# Patient Record
Sex: Female | Born: 1985 | Race: Black or African American | Hispanic: No | Marital: Single | Smoking: Current every day smoker
Health system: Southern US, Community
[De-identification: ages and names within clinical notes are randomized; demographics above are authoritative.]

## PROBLEM LIST (undated history)

## (undated) ENCOUNTER — Inpatient Hospital Stay (HOSPITAL_COMMUNITY): Payer: Self-pay

## (undated) DIAGNOSIS — B999 Unspecified infectious disease: Secondary | ICD-10-CM

## (undated) DIAGNOSIS — M549 Dorsalgia, unspecified: Secondary | ICD-10-CM

## (undated) DIAGNOSIS — J45909 Unspecified asthma, uncomplicated: Secondary | ICD-10-CM

## (undated) DIAGNOSIS — N63 Unspecified lump in unspecified breast: Secondary | ICD-10-CM

## (undated) HISTORY — PX: NO PAST SURGERIES: SHX2092

## (undated) HISTORY — PX: INDUCED ABORTION: SHX677

---
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
## 2007-03-06 ENCOUNTER — Inpatient Hospital Stay (HOSPITAL_COMMUNITY): Admission: AD | Admit: 2007-03-06 | Discharge: 2007-03-06 | Payer: Self-pay | Admitting: Obstetrics and Gynecology

## 2007-04-10 ENCOUNTER — Emergency Department (HOSPITAL_COMMUNITY): Admission: EM | Admit: 2007-04-10 | Discharge: 2007-04-10 | Payer: Self-pay | Admitting: Emergency Medicine

## 2007-05-12 ENCOUNTER — Ambulatory Visit (HOSPITAL_COMMUNITY): Admission: RE | Admit: 2007-05-12 | Discharge: 2007-05-12 | Payer: Self-pay | Admitting: Obstetrics

## 2007-10-05 ENCOUNTER — Inpatient Hospital Stay (HOSPITAL_COMMUNITY): Admission: AD | Admit: 2007-10-05 | Discharge: 2007-10-05 | Payer: Self-pay | Admitting: Obstetrics

## 2007-10-08 ENCOUNTER — Inpatient Hospital Stay (HOSPITAL_COMMUNITY): Admission: AD | Admit: 2007-10-08 | Discharge: 2007-10-08 | Payer: Self-pay | Admitting: Obstetrics

## 2007-10-09 ENCOUNTER — Inpatient Hospital Stay (HOSPITAL_COMMUNITY): Admission: AD | Admit: 2007-10-09 | Discharge: 2007-10-11 | Payer: Self-pay | Admitting: Obstetrics & Gynecology

## 2010-03-25 ENCOUNTER — Encounter: Payer: Self-pay | Admitting: Internal Medicine

## 2010-11-22 LAB — CBC
HCT: 28.1 — ABNORMAL LOW
Hemoglobin: 9.4 — ABNORMAL LOW
MCHC: 33.4
Platelets: 216
RBC: 3.8 — ABNORMAL LOW
RDW: 21.1 — ABNORMAL HIGH
WBC: 9

## 2010-11-22 LAB — POCT PREGNANCY, URINE: Operator id: 239321

## 2010-11-22 LAB — URINE CULTURE

## 2010-11-22 LAB — URINALYSIS, ROUTINE W REFLEX MICROSCOPIC
Ketones, ur: NEGATIVE
Nitrite: POSITIVE — AB
Protein, ur: NEGATIVE
Urobilinogen, UA: 0.2
pH: 7

## 2010-11-22 LAB — COMPREHENSIVE METABOLIC PANEL
ALT: 11
Albumin: 3.4 — ABNORMAL LOW
Alkaline Phosphatase: 40
CO2: 25
Chloride: 103
Creatinine, Ser: 0.55
GFR calc Af Amer: >60
GFR calc non Af Amer: >60
Glucose, Bld: 95
Potassium: 3.6

## 2010-11-22 LAB — URINE MICROSCOPIC-ADD ON

## 2010-11-22 LAB — WET PREP, GENITAL: Yeast Wet Prep HPF POC: NONE SEEN

## 2010-11-22 LAB — GC/CHLAMYDIA PROBE AMP, GENITAL: Chlamydia, DNA Probe: NEGATIVE

## 2010-11-23 LAB — CBC
HCT: 31.7 — ABNORMAL LOW
Hemoglobin: 10.7 — ABNORMAL LOW
MCHC: 33.6
MCV: 76.8 — ABNORMAL LOW
RDW: 21.2 — ABNORMAL HIGH
WBC: 14 — ABNORMAL HIGH

## 2010-11-23 LAB — DIFFERENTIAL
Basophils Absolute: 0
Basophils Relative: 0
Lymphocytes Relative: 8 — ABNORMAL LOW
Lymphs Abs: 1.1
Monocytes Absolute: 0.4
Monocytes Relative: 3
Neutrophils Relative %: 89 — ABNORMAL HIGH

## 2010-11-23 LAB — BASIC METABOLIC PANEL
CO2: 23
Calcium: 9.2
Creatinine, Ser: 0.46
GFR calc non Af Amer: >60

## 2010-11-30 LAB — CBC
HCT: 26.2 — ABNORMAL LOW
Hemoglobin: 8.9 — ABNORMAL LOW
MCHC: 32.1
MCHC: 32.3
Platelets: 109 — ABNORMAL LOW
Platelets: 130 — ABNORMAL LOW
RBC: 3.05 — ABNORMAL LOW
RBC: 3.6 — ABNORMAL LOW
RDW: 15.2
RDW: 15.4
WBC: 10.4
WBC: 13.3 — ABNORMAL HIGH

## 2010-11-30 LAB — CCBB MATERNAL DONOR DRAW

## 2010-11-30 LAB — RPR: RPR Ser Ql: NONREACTIVE

## 2010-12-17 LAB — URINE MICROSCOPIC-ADD ON

## 2010-12-17 LAB — CBC
Platelets: 159
WBC: 23 — ABNORMAL HIGH

## 2010-12-17 LAB — I-STAT 8, (EC8 V) (CONVERTED LAB)
BUN: 8
Chloride: 106
Glucose, Bld: 86
Potassium: 4.6
pH, Ven: 7.292

## 2010-12-17 LAB — DIFFERENTIAL
Eosinophils Absolute: 0
Lymphocytes Relative: 2 — ABNORMAL LOW
Lymphs Abs: 0.4 — ABNORMAL LOW
Neutro Abs: 22.4 — ABNORMAL HIGH
Neutrophils Relative %: 95 — ABNORMAL HIGH

## 2010-12-17 LAB — POCT URINALYSIS DIP (DEVICE)
Glucose, UA: 100 — AB
Nitrite: NEGATIVE
Operator id: 247071
Protein, ur: >=300 — AB
Urobilinogen, UA: 1

## 2010-12-17 LAB — GC/CHLAMYDIA PROBE AMP, GENITAL: GC Probe Amp, Genital: POSITIVE — AB

## 2010-12-17 LAB — URINALYSIS, ROUTINE W REFLEX MICROSCOPIC
Ketones, ur: 15 — AB
Nitrite: NEGATIVE
Specific Gravity, Urine: 1.018
pH: 6

## 2010-12-17 LAB — LIPASE, BLOOD: Lipase: 10 — ABNORMAL LOW

## 2010-12-17 LAB — COMPREHENSIVE METABOLIC PANEL
Alkaline Phosphatase: 73
BUN: 8
Chloride: 104
Glucose, Bld: 98
Potassium: 4.2
Total Bilirubin: 1.1
Total Protein: 8.9 — ABNORMAL HIGH

## 2010-12-17 LAB — POCT PREGNANCY, URINE: Operator id: 239701

## 2012-03-04 NOTE — L&D Delivery Note (Signed)
Attestation of Attending Supervision of Advanced Practitioner: Evaluation and management procedures were performed by the PA/NP/CNM/OB Fellow under my supervision/collaboration. Chart reviewed and agree with management and plan.  Saber Dickerman V 10/09/2012 9:18 PM

## 2012-03-04 NOTE — L&D Delivery Note (Signed)
Delivery Note At 8:08 PM a viable and healthy female was delivered via Vaginal, Spontaneous Delivery (Presentation: Left Occiput Anterior;  ).  APGAR: 9,9 ; weight pending.   Placenta status: intact, spontaneous .  Cord: 3 vessel with the following complications: none.    Anesthesia:   Episiotomy:  Lacerations: none Suture Repair: none Est. Blood Loss (mL): 300  Mom to postpartum.  Baby to nursery-stable.  Tawni Carnes 10/09/2012, 8:22 PM

## 2012-04-06 ENCOUNTER — Inpatient Hospital Stay (HOSPITAL_COMMUNITY)
Admission: AD | Admit: 2012-04-06 | Discharge: 2012-04-06 | Disposition: A | Discharge status: Home / Self Care | Payer: Medicaid Other | Source: Ambulatory Visit | Attending: Obstetrics & Gynecology | Admitting: Obstetrics & Gynecology

## 2012-04-06 ENCOUNTER — Encounter (HOSPITAL_COMMUNITY): Payer: Self-pay

## 2012-04-06 DIAGNOSIS — R51 Headache: Secondary | ICD-10-CM

## 2012-04-06 DIAGNOSIS — M545 Low back pain, unspecified: Secondary | ICD-10-CM | POA: Insufficient documentation

## 2012-04-06 DIAGNOSIS — R42 Dizziness and giddiness: Secondary | ICD-10-CM

## 2012-04-06 DIAGNOSIS — H538 Other visual disturbances: Secondary | ICD-10-CM

## 2012-04-06 DIAGNOSIS — M549 Dorsalgia, unspecified: Secondary | ICD-10-CM

## 2012-04-06 DIAGNOSIS — O99891 Other specified diseases and conditions complicating pregnancy: Secondary | ICD-10-CM

## 2012-04-06 DIAGNOSIS — O219 Vomiting of pregnancy, unspecified: Secondary | ICD-10-CM

## 2012-04-06 HISTORY — DX: Unspecified asthma, uncomplicated: J45.909

## 2012-04-06 LAB — URINE MICROSCOPIC-ADD ON

## 2012-04-06 LAB — CBC
MCH: 25.9 pg — ABNORMAL LOW (ref 26.0–34.0)
MCV: 76.8 fL — ABNORMAL LOW (ref 78.0–100.0)
Platelets: 163 10*3/uL (ref 150–400)
RBC: 4.06 MIL/uL (ref 3.87–5.11)
RDW: 16.2 % — ABNORMAL HIGH (ref 11.5–15.5)

## 2012-04-06 LAB — URINALYSIS, ROUTINE W REFLEX MICROSCOPIC
Nitrite: NEGATIVE
Specific Gravity, Urine: 1.025 (ref 1.005–1.030)
pH: 6 (ref 5.0–8.0)

## 2012-04-06 MED ORDER — PROMETHAZINE HCL 25 MG PO TABS: 12.5000 mg | ORAL_TABLET | Freq: Four times a day (QID) | ORAL | Status: DC | PRN

## 2012-04-06 MED ORDER — PRENATAL VITAMINS 28-0.8 MG PO TABS: 1.0000 | ORAL_TABLET | Freq: Every day | ORAL | Status: DC

## 2012-04-06 NOTE — MAU Note (Signed)
Patient states she has periods of feeling faint and dizzy for about 3 weeks, mostly at her job. Has had low back pain on the right side for 4 days. Nausea for about one month with occasional vomiting. Patient has had a positive home pregnancy test in December.

## 2012-04-06 NOTE — MAU Provider Note (Signed)
Attestation of Attending Supervision of Advanced Practitioner (PA/CNM/NP): Evaluation and management procedures were performed by the Advanced Practitioner under my supervision and collaboration.  I have reviewed the Advanced Practitioner's note and chart, and I agree with the management and plan.  Julietta Batterman, MD, FACOG Attending Obstetrician & Gynecologist Faculty Practice, [facility]   

## 2012-04-06 NOTE — MAU Provider Note (Signed)
History    CSN: [id number]  Arrival date and time: 04/06/12 1017   First Provider Initiated Contact with Patient 04/06/12 1139      Chief Complaint  Patient presents with  . Dizziness  . Back Pain  . Nausea   HPI Patient reports to the MAU complaining of episodes of dizziness that are accompanied by blurry vision, headaches and nausea. Patient is [redacted]w[redacted]d pregnant. The patient reports that she has been having the episodes over the last 3 weeks, and notices them more at work. If she sits down during an episode the dizziness will subside over 30 minutes to and hour. She denies any loss of consciousness or fainting. 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 She reports she is doing her best to drink a lot of water throughout the day, and is eating multiple meals daily.   She reports an episode of fainting during her pregnancy 4 years ago that was followed by similar dizzy symptoms. She was taken to the emergency room for her previous episode of fainting, but was discharged later that same day. Patient does not remember any diagnosis coming from this ED visit, and had no problems with the rest of her pregnancy.   The patient also complains of low right back pain for 3 weeks. She denies any trauma to her low back, and denies and heavy lifting or exercise over this time. She also denies any burning with urination or hematuria. She reports a "pulling sensation" when she moves around, but denies any sharp pain, numbness or tingling. She reports that a heating pad has helped with her back pain.  She denies any previous medical problems and is planning to see Dr. Clearance Coots for her prenatal care, but it awaiting insurance clearance.  OB History    Grav Para Term Preterm Abortions TAB SAB Ect Mult Living   4 2 2  1 1    2       Past Medical History  Diagnosis Date  . Asthma     Past Surgical  History  Procedure Date  . No past surgeries     History reviewed. No pertinent family history.  History  Substance Use Topics  . Smoking status: Current Some Day Smoker    Types: Cigarettes  . Smokeless tobacco: Not on file  . Alcohol Use: No    Allergies:  Allergies  Allergen Reactions  . Peanut-Containing Drug Products Anaphylaxis    Pecans also  . Shellfish Allergy Anaphylaxis    Prescriptions prior to admission  Medication Sig Dispense Refill  . OVER THE COUNTER MEDICATION Apply 1 application topically daily. Topical cream for eczema, pt purchases over the counter      . promethazine (PHENERGAN) 25 MG tablet Take 25 mg by mouth daily as needed. For nausea        Review of Systems  Eyes: Positive for blurred vision.  Cardiovascular: Negative for chest pain and leg swelling.  Gastrointestinal: Positive for nausea. Negative for abdominal pain and diarrhea.  Genitourinary: Negative for dysuria, urgency and hematuria.  Musculoskeletal: Positive for back pain. Negative for falls.  Neurological: Positive for dizziness and headaches. Negative for seizures and loss of consciousness.  GU: Negative for vaginal bleeding, negative for vaginal discharge.  Physical Exam   Blood pressure 115/72, pulse 112, temperature 98.2 F (36.8 C), resp. rate 16, height 5' 4.5" (1.638  m), weight 56.881 kg (125 lb 6.4 oz), last menstrual period 01/07/2012, SpO2 100.00%.  Physical Exam  Constitutional: She appears well-developed and well-nourished.  Eyes: Conjunctivae normal are normal. Pupils are equal, round, and reactive to light.  Cardiovascular: Normal rate, regular rhythm and normal heart sounds.   Respiratory: Effort normal and breath sounds normal.  GI: Soft. There is no tenderness.  Musculoskeletal: Normal range of motion. She exhibits tenderness.   FHT 160 by doppler  MAU Course  Procedures Labs:  CBC: Hgb 10.5, Hct 31.2, MCV 76.8, MCH 25.9, RDW 16.2   Assessment and Plan    1. Intrauterine pregnancy at [redacted]w[redacted]d 2. Hgb 10.5--increase iron-containing foods in diet, PNV prescribed 3. Dizziness- continue to drink water and take frequent breaks from standing at work when possible.  Letter given for employer to allow sitting, increase PO fluids. 4. Low back pain, right- continue to use heating pad, rest, tylenol PRN for pain   I have seen this patient and agree with the above PA student's note.  Pt to start prenatal care as scheduled with Dr Clearance Coots.    LEFTWICH-KIRBY, LISA Certified Nurse-Midwife  LEFTWICH-KIRBY, LISA 04/06/2012, 12:27 PM

## 2012-04-15 ENCOUNTER — Other Ambulatory Visit: Payer: Self-pay | Admitting: Advanced Practice Midwife

## 2012-07-03 ENCOUNTER — Inpatient Hospital Stay (HOSPITAL_COMMUNITY)
Admission: AD | Admit: 2012-07-03 | Discharge: 2012-07-03 | Disposition: A | Discharge status: Home / Self Care | Payer: Medicaid Other | Source: Ambulatory Visit | Attending: Obstetrics & Gynecology | Admitting: Obstetrics & Gynecology

## 2012-07-03 ENCOUNTER — Encounter (HOSPITAL_COMMUNITY): Payer: Self-pay | Admitting: *Deleted

## 2012-07-03 DIAGNOSIS — O26899 Other specified pregnancy related conditions, unspecified trimester: Secondary | ICD-10-CM

## 2012-07-03 DIAGNOSIS — O2390 Unspecified genitourinary tract infection in pregnancy, unspecified trimester: Secondary | ICD-10-CM

## 2012-07-03 DIAGNOSIS — R109 Unspecified abdominal pain: Secondary | ICD-10-CM | POA: Insufficient documentation

## 2012-07-03 DIAGNOSIS — O2342 Unspecified infection of urinary tract in pregnancy, second trimester: Secondary | ICD-10-CM

## 2012-07-03 DIAGNOSIS — N390 Urinary tract infection, site not specified: Secondary | ICD-10-CM | POA: Insufficient documentation

## 2012-07-03 HISTORY — DX: Unspecified lump in unspecified breast: N63.0

## 2012-07-03 LAB — URINALYSIS, ROUTINE W REFLEX MICROSCOPIC
Bilirubin Urine: NEGATIVE
Hgb urine dipstick: NEGATIVE
Ketones, ur: NEGATIVE mg/dL
Nitrite: POSITIVE — AB
pH: 6 (ref 5.0–8.0)

## 2012-07-03 LAB — CBC WITH DIFFERENTIAL/PLATELET
Basophils Absolute: 0 10*3/uL (ref 0.0–0.1)
Basophils Relative: 0 % (ref 0–1)
Eosinophils Absolute: 0.2 10*3/uL (ref 0.0–0.7)
Eosinophils Relative: 2 % (ref 0–5)
Lymphs Abs: 1.6 10*3/uL (ref 0.7–4.0)
MCH: 25.1 pg — ABNORMAL LOW (ref 26.0–34.0)
MCHC: 32.5 g/dL (ref 30.0–36.0)
MCV: 77.1 fL — ABNORMAL LOW (ref 78.0–100.0)
Neutrophils Relative %: 80 % — ABNORMAL HIGH (ref 43–77)
Platelets: 126 10*3/uL — ABNORMAL LOW (ref 150–400)
RBC: 3.27 MIL/uL — ABNORMAL LOW (ref 3.87–5.11)
RDW: 14.7 % (ref 11.5–15.5)

## 2012-07-03 LAB — URINE MICROSCOPIC-ADD ON

## 2012-07-03 LAB — WET PREP, GENITAL
Trich, Wet Prep: NONE SEEN
Yeast Wet Prep HPF POC: NONE SEEN

## 2012-07-03 MED ORDER — CEPHALEXIN 500 MG PO CAPS: 500.0000 mg | ORAL_CAPSULE | Freq: Three times a day (TID) | ORAL | Status: DC

## 2012-07-03 NOTE — MAU Note (Signed)
Spotting yesterday. No bleeding today. Cramping off and on for a week. Had intercourse 5 days ago.

## 2012-07-03 NOTE — MAU Provider Note (Signed)
History     CSN: [id number]  Arrival date and time: 07/03/12 1552   First Provider Initiated Contact with Patient 07/03/12 1632      Chief Complaint  Patient presents with  . Abdominal Cramping   HPI Gabrielle [name] is 27 y.o. [phone] [redacted]w[redacted]d weeks presenting with cramping off and on X 1 week.  Spotting yesterday, none today.  Sexually active X 1 partner.  Denies UTI, nausea and vomiting, diarrhea,constipation.  LMP 01/06/13, normal menses.  Plans care with Dr. Clearance Coots after her Medicaid is approved.  She states she applied in January and is having difficulty getting through   Denies hx of preterm labor.  Had intercourse 5 days ago.   Past Medical History  Diagnosis Date  . Asthma   . Breast mass     benign, L breast    Past Surgical History  Procedure Laterality Date  . No past surgeries      History reviewed. No pertinent family history.  History  Substance Use Topics  . Smoking status: Former Smoker    Types: Cigarettes    Quit date: 04/05/2012  . Smokeless tobacco: Not on file  . Alcohol Use: No    Allergies:  Allergies  Allergen Reactions  . Peanut-Containing Drug Products Anaphylaxis    Pecans also  . Shellfish Allergy Anaphylaxis    Prescriptions prior to admission  Medication Sig Dispense Refill  . acetaminophen (TYLENOL) 325 MG tablet Take 650 mg by mouth every 6 (six) hours as needed for pain (abd. cramping).      Marland Kitchen acetaminophen (TYLENOL) 500 MG chewable tablet Chew 500 mg by mouth every 6 (six) hours as needed for pain.      . Prenatal Vit-Min-FA-Fish Oil ([facility] PRENATAL GUMMY PO) Take 2 tablets by mouth daily.      . Simethicone (ALKA-SELTZER ANTI-GAS PO) Take 1 tablet by mouth daily as needed (indigestion).        Review of Systems  Constitutional: Negative for fever and chills.  Gastrointestinal: Positive for abdominal pain (intermittent cramping X 1 week). Negative for nausea, vomiting, diarrhea and constipation.  Genitourinary:       + for vaginal  spotting yesterday, none today  Neurological: Negative for headaches.   Physical Exam   Blood pressure 114/66, pulse 105, temperature 98.3 F (36.8 C), temperature source Oral, resp. rate 18, height 5\' 4"  (1.626 m), weight 144 lb (65.318 kg), last menstrual period 01/07/2012.  Physical Exam  Constitutional: She is oriented to person, place, and time. She appears well-developed and well-nourished. No distress.  HENT:  Head: Normocephalic.  Neck: Normal range of motion.  Cardiovascular: Normal rate.   Respiratory: Effort normal.  GI: Soft. She exhibits no distension and no mass. There is no tenderness. There is no rebound and no guarding.  Genitourinary: There is no tenderness, lesion or injury on the right labia. There is no tenderness, lesion or injury on the left labia. Uterus is enlarged. Uterus is not tender. Cervix exhibits no discharge and no friability. No erythema, tenderness or bleeding around the vagina. Vaginal discharge (small amount of white discharge without odor) found.  Neurological: She is alert and oriented to person, place, and time.  Skin: Skin is warm and dry.  Psychiatric: She has a normal mood and affect. Her behavior is normal.   Results for orders placed during the hospital encounter of 07/03/12 (from the past 24 hour(s))  URINALYSIS, ROUTINE W REFLEX MICROSCOPIC     Status: Abnormal   Collection Time  07/03/12  4:19 PM      Result Value Range   Color, Urine YELLOW  YELLOW   APPearance CLEAR  CLEAR   Specific Gravity, Urine 1.020  1.005 - 1.030   pH 6.0  5.0 - 8.0   Glucose, UA NEGATIVE  NEGATIVE mg/dL   Hgb urine dipstick NEGATIVE  NEGATIVE   Bilirubin Urine NEGATIVE  NEGATIVE   Ketones, ur NEGATIVE  NEGATIVE mg/dL   Protein, ur NEGATIVE  NEGATIVE mg/dL   Urobilinogen, UA 0.2  0.0 - 1.0 mg/dL   Nitrite POSITIVE (*) NEGATIVE   Leukocytes, UA NEGATIVE  NEGATIVE  URINE MICROSCOPIC-ADD ON     Status: Abnormal   Collection Time    07/03/12  4:19 PM       Result Value Range   Squamous Epithelial / LPF MANY (*) RARE   WBC, UA 3-6  <3 WBC/hpf   RBC / HPF 0-2  <3 RBC/hpf   Bacteria, UA RARE  RARE   Urine-Other RENAL TUBULAR EPITHELIALS PRESENT    WET PREP, GENITAL     Status: Abnormal   Collection Time    07/03/12  4:53 PM      Result Value Range   Yeast Wet Prep HPF POC NONE SEEN  NONE SEEN   Trich, Wet Prep NONE SEEN  NONE SEEN   Clue Cells Wet Prep HPF POC NONE SEEN  NONE SEEN   WBC, Wet Prep HPF POC MODERATE (*) NONE SEEN  CBC WITH DIFFERENTIAL     Status: Abnormal   Collection Time    07/03/12  5:10 PM      Result Value Range   WBC 11.6 (*) 4.0 - 10.5 K/uL   RBC 3.27 (*) 3.87 - 5.11 MIL/uL   Hemoglobin 8.2 (*) 12.0 - 15.0 g/dL   HCT 16.1 (*) 09.6 - 04.5 %   MCV 77.1 (*) 78.0 - 100.0 fL   MCH 25.1 (*) 26.0 - 34.0 pg   MCHC 32.5  30.0 - 36.0 g/dL   RDW 40.9  81.1 - 91.4 %   Platelets 126 (*) 150 - 400 K/uL   Neutrophils Relative 80 (*) 43 - 77 %   Neutro Abs 9.2 (*) 1.7 - 7.7 K/uL   Lymphocytes Relative 14  12 - 46 %   Lymphs Abs 1.6  0.7 - 4.0 K/uL   Monocytes Relative 5  3 - 12 %   Monocytes Absolute 0.5  0.1 - 1.0 K/uL   Eosinophils Relative 2  0 - 5 %   Eosinophils Absolute 0.2  0.0 - 0.7 K/uL   Basophils Relative 0  0 - 1 %   Basophils Absolute 0.0  0.0 - 0.1 K/uL    MAU Course  Procedures  Urine culture and Gc/CHL culture to lab  MDM FMS baseline heart rate 150, moderate variability, reassuring for 25w.  No uterine contractions.  Assessment and Plan  A:  Cramping in pregnancy at [redacted]w[redacted]d gestation by reliable  LMP      Urinary Tract Infection     Hx of anemia--low hgb   P:Keflex 500mg  tid X 1 week    Encouraged her to begin prenatal care    Increase po fluids   Continue prenatal vitamins discussed with patient the need to take qd and add Fe rich foods to her diet       Mistina Coatney,EVE M 07/03/2012, 5:43 PM

## 2012-07-21 ENCOUNTER — Encounter: Payer: Self-pay | Admitting: Obstetrics & Gynecology

## 2012-07-21 ENCOUNTER — Ambulatory Visit (INDEPENDENT_AMBULATORY_CARE_PROVIDER_SITE_OTHER): Payer: Self-pay | Admitting: Obstetrics & Gynecology

## 2012-07-21 VITALS — BP 114/70 | Temp 97.2°F | Wt 146.1 lb

## 2012-07-21 DIAGNOSIS — Z3689 Encounter for other specified antenatal screening: Secondary | ICD-10-CM

## 2012-07-21 DIAGNOSIS — O0933 Supervision of pregnancy with insufficient antenatal care, third trimester: Secondary | ICD-10-CM

## 2012-07-21 DIAGNOSIS — O0930 Supervision of pregnancy with insufficient antenatal care, unspecified trimester: Secondary | ICD-10-CM

## 2012-07-21 LAB — POCT URINALYSIS DIP (DEVICE)
Ketones, ur: NEGATIVE mg/dL
Protein, ur: NEGATIVE mg/dL
Specific Gravity, Urine: 1.025 (ref 1.005–1.030)
pH: 7 (ref 5.0–8.0)

## 2012-07-21 NOTE — Progress Notes (Signed)
  Subjective:    Gabrielle [name] is being seen today for her first obstetrical visit.  This is not a planned pregnancy. She is at [redacted]w[redacted]d gestation. Her obstetrical history is significant for smoker and late prenatal care. Relationship with FOB: significant other, living together. Patient does intend to breast feed. Pregnancy history fully reviewed.  Patient reports no complaints. Past Medical History  Diagnosis Date  . Asthma   . Breast mass     benign, L breast   Past Surgical History  Procedure Laterality Date  . No past surgeries     Current Outpatient Prescriptions on File Prior to Visit  Medication Sig Dispense Refill  . acetaminophen (TYLENOL) 325 MG tablet Take 650 mg by mouth every 6 (six) hours as needed for pain (abd. cramping).      Marland Kitchen acetaminophen (TYLENOL) 500 MG chewable tablet Chew 500 mg by mouth every 6 (six) hours as needed for pain.      . Prenatal Vit-Min-FA-Fish Oil ([employer]S PRENATAL GUMMY PO) Take 2 tablets by mouth daily.      . cephALEXin (KEFLEX) 500 MG capsule Take 1 capsule (500 mg total) by mouth 3 (three) times daily.  21 capsule  0  . Simethicone (ALKA-SELTZER ANTI-GAS PO) Take 1 tablet by mouth daily as needed (indigestion).       No current facility-administered medications on file prior to visit.   Allergies  Allergen Reactions  . Peanut-Containing Drug Products Anaphylaxis    Pecans also  . Shellfish Allergy Anaphylaxis   History   Social History  . Marital Status: Single    Spouse Name: N/A    Number of Children: N/A  . Years of Education: N/A   Occupational History  . Not on file.   Social History Main Topics  . Smoking status: Former Smoker    Types: Cigarettes    Quit date: 04/05/2012  . Smokeless tobacco: Never Used  . Alcohol Use: No  . Drug Use: No  . Sexually Active: Yes     Comment: 5 days ago   Other Topics Concern  . Not on file   Social History Narrative  . No narrative on file         Review of Systems:   Review  of Systems  Objective:     BP 114/70  Temp(Src) 97.2 F (36.2 C)  Wt 146 lb 1.6 oz (66.271 kg)  BMI 25.07 kg/m2  LMP 01/07/2012 Physical Exam Lungs: CTA CV: RRR Breast: no lesion noted, no nipple d/c or masses  ABD; gravid, FH 28cm; NT, ND GU: EGBUS: no lesions Vagina: no blood in vault Cervix: no lesion; no mucopurulent d/c; friable; bleeds easily FT; soft, long Adnexa: no masses; non tender       Exam    Assessment:    Pregnancy: [id number] Patient Active Problem List   Diagnosis Date Noted  . Late prenatal care 07/21/2012       Plan:     Initial labs drawn/28week labs drawn. Prenatal vitamins. Problem list reviewed and updated. Role of ultrasound in pregnancy discussed; fetal survey: requested. Amniocentesis discussed: not indicated. Follow up in 2 weeks. HARRAWAY-SMITH, Quanta Robertshaw 07/21/2012

## 2012-07-21 NOTE — Progress Notes (Signed)
Pulse- 96  Edema-feet  Pain/pressure-pelvic New ob packet given Weight gain of 25-35lbs

## 2012-07-21 NOTE — Patient Instructions (Addendum)
Contraception Choices  Contraception (birth control) is the use of any methods or devices to prevent pregnancy. Below are some methods to help avoid pregnancy.  HORMONAL METHODS   · Contraceptive implant. This is a thin, plastic tube containing progesterone hormone. It does not contain estrogen hormone. Your caregiver inserts the tube in the inner part of the upper arm. The tube can remain in place for up to 3 years. After 3 years, the implant must be removed. The implant prevents the ovaries from releasing an egg (ovulation), thickens the cervical mucus which prevents sperm from entering the uterus, and thins the lining of the inside of the uterus.  · Progesterone-only injections. These injections are given every 3 months by your caregiver to prevent pregnancy. This synthetic progesterone hormone stops the ovaries from releasing eggs. It also thickens cervical mucus and changes the uterine lining. This makes it harder for sperm to survive in the uterus.  · Birth control pills. These pills contain estrogen and progesterone hormone. They work by stopping the egg from forming in the ovary (ovulation). Birth control pills are prescribed by a caregiver. Birth control pills can also be used to treat heavy periods.  · Minipill. This type of birth control pill contains only the progesterone hormone. They are taken every day of each month and must be prescribed by your caregiver.  · Birth control patch. The patch contains hormones similar to those in birth control pills. It must be changed once a week and is prescribed by a caregiver.  · Vaginal ring. The ring contains hormones similar to those in birth control pills. It is left in the vagina for 3 weeks, removed for 1 week, and then a new one is put back in place. The patient must be comfortable inserting and removing the ring from the vagina. A caregiver's prescription is necessary.  · Emergency contraception. Emergency contraceptives prevent pregnancy after unprotected  sexual intercourse. This pill can be taken right after sex or up to 5 days after unprotected sex. It is most effective the sooner you take the pills after having sexual intercourse. Emergency contraceptive pills are available without a prescription. Check with your pharmacist. Do not use emergency contraception as your only form of birth control.  BARRIER METHODS   · Female condom. This is a thin sheath (latex or rubber) that is worn over the penis during sexual intercourse. It can be used with spermicide to increase effectiveness.  · Female condom. This is a soft, loose-fitting sheath that is put into the vagina before sexual intercourse.  · Diaphragm. This is a soft, latex, dome-shaped barrier that must be fitted by a caregiver. It is inserted into the vagina, along with a spermicidal jelly. It is inserted before intercourse. The diaphragm should be left in the vagina for 6 to 8 hours after intercourse.  · Cervical cap. This is a round, soft, latex or plastic cup that fits over the cervix and must be fitted by a caregiver. The cap can be left in place for up to 48 hours after intercourse.  · Sponge. This is a soft, circular piece of polyurethane foam. The sponge has spermicide in it. It is inserted into the vagina after wetting it and before sexual intercourse.  · Spermicides. These are chemicals that kill or block sperm from entering the cervix and uterus. They come in the form of creams, jellies, suppositories, foam, or tablets. They do not require a prescription. They are inserted into the vagina with an applicator before having sexual intercourse.   IUD). This is a T-shaped device that is put in a woman's uterus during a menstrual period to prevent pregnancy. There are 2 types:  Copper IUD. This type of IUD is wrapped in copper wire and is placed inside the uterus. Copper makes the uterus and  fallopian tubes produce a fluid that kills sperm. It can stay in place for 10 years.  Hormone IUD. This type of IUD contains the hormone progestin (synthetic progesterone). The hormone thickens the cervical mucus and prevents sperm from entering the uterus, and it also thins the uterine lining to prevent implantation of a fertilized egg. The hormone can weaken or kill the sperm that get into the uterus. It can stay in place for 5 years. PERMANENT METHODS OF CONTRACEPTION  Female tubal ligation. This is when the woman's fallopian tubes are surgically sealed, tied, or blocked to prevent the egg from traveling to the uterus.  Female sterilization. This is when the female has the tubes that carry sperm tied off (vasectomy).This blocks sperm from entering the vagina during sexual intercourse. After the procedure, the man can still ejaculate fluid (semen). NATURAL PLANNING METHODS  Natural family planning. This is not having sexual intercourse or using a barrier method (condom, diaphragm, cervical cap) on days the woman could become pregnant.  Calendar method. This is keeping track of the length of each menstrual cycle and identifying when you are fertile.  Ovulation method. This is avoiding sexual intercourse during ovulation.  Symptothermal method. This is avoiding sexual intercourse during ovulation, using a thermometer and ovulation symptoms.  Post-ovulation method. This is timing sexual intercourse after you have ovulated. Regardless of which type or method of contraception you choose, it is important that you use condoms to protect against the transmission of sexually transmitted diseases (STDs). Talk with your caregiver about which form of contraception is most appropriate for you. Document Released: 02/18/2005 Document Revised: 05/13/2011 Document Reviewed: 06/27/2010 [facility] Patient Information 2013 [place], [place]. Glucose Tolerance Test During Pregnancy The glucose tolerance test (GTT) or  3-hour glucose test can be used to determine if a woman has diabetes that first begins or is first recognized during pregnancy (gestational diabetes). Typically, a GTT is done after you have had a 1-hour glucose test with results that indicate you possibly have gestational diabetes.  The test takes about 3 hours. There will be a series of blood tests after you drink the sugar water solution. You must remain at the testing location to make sure that your blood is drawn on time.  LET YOUR CAREGIVER KNOW ABOUT:  Allergies to food or medicine.  Medicines taken, including vitamins, herbs, eyedrops, over-the-counter medicines, and creams.  Any recent illnesses or infections. BEFORE THE PROCEDURE The GTT is a fasting test, meaning you must stop eating for a certain amount of time. The test will be the most accurate if you have not eaten for 8 12 hours before the test. For this reason, it is recommended that you have this test done in the morning before you have breakfast. PROCEDURE  Do not eat or drink anything but water during the test. When you arrive at the lab, a sample of your blood is taken to get your fasting blood glucose level. After your fasting glucose level is determined, you will be given a sugar water solution to drink. You will be asked to wait in a certain area until your next blood test. The blood tests are done each hour for 3 hours. Stay close to the lab so your  blood samples can be taken on time. This is important. If the blood samples are not taken on time, the test will need to be done again on another day.  AFTER THE PROCEDURE  You can eat and drink as usual.   Ask when your test results will be ready. Make sure you get your test results. A positive test is considered when two of the four blood test values are equal or above the normal blood glucose level. Document Released: 08/20/2011 Document Reviewed: 06/13/2011 [facility] Patient Information 2013 [place], [place].

## 2012-07-22 ENCOUNTER — Encounter: Payer: Self-pay | Admitting: Obstetrics & Gynecology

## 2012-07-22 DIAGNOSIS — O99013 Anemia complicating pregnancy, third trimester: Secondary | ICD-10-CM | POA: Insufficient documentation

## 2012-07-22 LAB — OBSTETRIC PANEL
Eosinophils Absolute: 0.2 10*3/uL (ref 0.0–0.7)
HCT: 26.4 % — ABNORMAL LOW (ref 36.0–46.0)
Hepatitis B Surface Ag: NEGATIVE
Lymphs Abs: 1.6 10*3/uL (ref 0.7–4.0)
MCHC: 32.2 g/dL (ref 30.0–36.0)
Monocytes Relative: 5 % (ref 3–12)
Neutro Abs: 8.2 10*3/uL — ABNORMAL HIGH (ref 1.7–7.7)
Platelets: 128 10*3/uL — ABNORMAL LOW (ref 150–400)
RDW: 15.2 % (ref 11.5–15.5)
Rh Type: POSITIVE
WBC: 10.5 10*3/uL (ref 4.0–10.5)

## 2012-07-22 LAB — CULTURE, OB URINE: Colony Count: NO GROWTH

## 2012-07-22 LAB — GLUCOSE TOLERANCE, 1 HOUR (50G) W/O FASTING: Glucose, 1 Hour GTT: 81 mg/dL (ref 70–140)

## 2012-07-23 ENCOUNTER — Telehealth: Payer: Self-pay | Admitting: *Deleted

## 2012-07-23 NOTE — Telephone Encounter (Signed)
Message copied by Mannie Stabile on Thu Jul 23, 2012  4:48 PM ------      Message from: Willodean Rosenthal      Created: Wed Jul 22, 2012 10:21 AM       Pt with anemia in pregnancy.  Needs to begin FeSO4 bid.  OTC.            Thx,      clh-S ------

## 2012-07-23 NOTE — Telephone Encounter (Signed)
Pt informed

## 2012-07-28 ENCOUNTER — Encounter (HOSPITAL_COMMUNITY): Payer: Self-pay | Admitting: *Deleted

## 2012-07-28 ENCOUNTER — Ambulatory Visit (HOSPITAL_COMMUNITY)
Admission: RE | Admit: 2012-07-28 | Discharge: 2012-07-28 | Disposition: A | Discharge status: Home / Self Care | Payer: Medicaid Other | Source: Ambulatory Visit | Attending: Obstetrics & Gynecology | Admitting: Obstetrics & Gynecology

## 2012-07-28 ENCOUNTER — Other Ambulatory Visit: Payer: Self-pay | Admitting: Obstetrics & Gynecology

## 2012-07-28 ENCOUNTER — Inpatient Hospital Stay (HOSPITAL_COMMUNITY)
Admission: AD | Admit: 2012-07-28 | Discharge: 2012-07-28 | Disposition: A | Discharge status: Home / Self Care | Payer: Medicaid Other | Source: Ambulatory Visit | Attending: Obstetrics & Gynecology | Admitting: Obstetrics & Gynecology

## 2012-07-28 DIAGNOSIS — O0933 Supervision of pregnancy with insufficient antenatal care, third trimester: Secondary | ICD-10-CM

## 2012-07-28 DIAGNOSIS — Z363 Encounter for antenatal screening for malformations: Secondary | ICD-10-CM | POA: Insufficient documentation

## 2012-07-28 DIAGNOSIS — O26879 Cervical shortening, unspecified trimester: Secondary | ICD-10-CM | POA: Insufficient documentation

## 2012-07-28 DIAGNOSIS — O0930 Supervision of pregnancy with insufficient antenatal care, unspecified trimester: Secondary | ICD-10-CM | POA: Insufficient documentation

## 2012-07-28 DIAGNOSIS — O26873 Cervical shortening, third trimester: Secondary | ICD-10-CM

## 2012-07-28 DIAGNOSIS — Z1389 Encounter for screening for other disorder: Secondary | ICD-10-CM | POA: Insufficient documentation

## 2012-07-28 DIAGNOSIS — O99013 Anemia complicating pregnancy, third trimester: Secondary | ICD-10-CM

## 2012-07-28 DIAGNOSIS — Z3689 Encounter for other specified antenatal screening: Secondary | ICD-10-CM

## 2012-07-28 DIAGNOSIS — O358XX0 Maternal care for other (suspected) fetal abnormality and damage, not applicable or unspecified: Secondary | ICD-10-CM | POA: Insufficient documentation

## 2012-07-28 MED ORDER — PROGESTERONE MICRONIZED 200 MG PO CAPS: 200.0000 mg | ORAL_CAPSULE | Freq: Every day | ORAL | Status: DC

## 2012-07-28 MED ORDER — BETAMETHASONE SOD PHOS & ACET 6 (3-3) MG/ML IJ SUSP
12.0000 mg | Freq: Once | INTRAMUSCULAR | Status: AC
  Administered 2012-07-28: 12 mg via INTRAMUSCULAR
  Filled 2012-07-28: qty 2

## 2012-07-28 MED ORDER — PROGESTERONE 200 MG VA SUPP: 200.0000 mg | Freq: Every day | VAGINAL | Status: DC

## 2012-07-28 NOTE — MAU Note (Signed)
Patient sent to MAU for further assessment. Was see in [facility] for U/S. Cervix dynamic and shortened. Patient states she thinks she feels occas. contractions, but nothing consistent.

## 2012-07-28 NOTE — MAU Provider Note (Signed)
  History     CSN: [id number]  Arrival date and time: 07/28/12 1629   First Provider Initiated Contact with Patient 07/28/12 1722      Chief Complaint  Patient presents with  . dynamic cx per U/S    HPI Gabrielle [name] is a 27 y.o. [id number] at [redacted]w[redacted]d sent from MFM for short cervix on today's ultrasound. She denies contractions, bleeding or loss of fluid. Baby is moving well.  Late prenatal care starting 5/20 at [facility]. Has had two normal pregnancies and term vaginal deliveries and one TAB. Seen for first ultrasound/anatlmy scan today and noted dynamic cervix 1.7 to 2.9 cm with funneling.    OB History   Grav Para Term Preterm Abortions TAB SAB Ect Mult Living   4 2 2  1 1    2       Past Medical History  Diagnosis Date  . Asthma   . Breast mass     benign, L breast    Past Surgical History  Procedure Laterality Date  . No past surgeries      Family History  Problem Relation Age of Onset  . Hypertension Mother     History  Substance Use Topics  . Smoking status: Former Smoker    Types: Cigarettes    Quit date: 04/05/2012  . Smokeless tobacco: Never Used  . Alcohol Use: No    Allergies:  Allergies  Allergen Reactions  . Peanut-Containing Drug Products Anaphylaxis    Pecans also  . Shellfish Allergy Anaphylaxis    Prescriptions prior to admission  Medication Sig Dispense Refill  . Prenatal Vit-Min-FA-Fish Oil ([facility] PRENATAL GUMMY PO) Take 2 each by mouth daily.         ROS pertinent pos and neg listed in HPI   Physical Exam   Blood pressure 110/70, pulse 97, temperature 98.5 F (36.9 C), temperature source Oral, resp. rate 16, last menstrual period 01/07/2012.  Physical Exam  Constitutional: She is oriented to person, place, and time. She appears well-developed and well-nourished. No distress.  HENT:  Head: Normocephalic and atraumatic.  Eyes: Conjunctivae and EOM are normal.  Neck: Normal range of motion. Neck supple.  Cardiovascular:  Normal rate.   Respiratory: Effort normal and breath sounds normal.  GI: Soft. There is no tenderness. There is no rebound and no guarding.  Musculoskeletal: She exhibits no edema and no tenderness.  Neurological: She is alert and oriented to person, place, and time.  Skin: Skin is warm and dry.  Psychiatric: She has a normal mood and affect.   No results found for this or any previous visit (from the past 24 hour(s)).   MAU Course  Procedures  FHTs:  Baselin 130-140, mod var, accels present, no decels - reactive TOCO: occasional ctx    Assessment and Plan  27 y.o. [id number] at [redacted]w[redacted]d with Short Cervix - Not contracting perceptibly, no other signs of preterm labor - BMZ first dose given today - return tomorrow 5:30 PM for second dose - Prometrium prescribed - pt given Rx for capsules at [facility] or vaginal suppositories at [facility] (compounded). Explained vaginal use nightly. Pt does not have Medicaid yet but stressed importance of treatment - pt can choose cheapest option until Medicaid approved -can talk to SW at next clinic visit about help with medications.  - Pelvic rest, limit activity (no prolonged standing, lifting, walking, etc.) - F/u at [facility] clinic on 6/3   Napoleon Form 07/28/2012, 6:52 PM

## 2012-07-29 ENCOUNTER — Inpatient Hospital Stay (HOSPITAL_COMMUNITY)
Admission: AD | Admit: 2012-07-29 | Discharge: 2012-07-29 | Disposition: A | Discharge status: Home / Self Care | Payer: Medicaid Other | Source: Ambulatory Visit | Attending: Obstetrics & Gynecology | Admitting: Obstetrics & Gynecology

## 2012-07-29 DIAGNOSIS — O3433 Maternal care for cervical incompetence, third trimester: Secondary | ICD-10-CM | POA: Insufficient documentation

## 2012-07-29 DIAGNOSIS — O4700 False labor before 37 completed weeks of gestation, unspecified trimester: Secondary | ICD-10-CM | POA: Insufficient documentation

## 2012-07-29 MED ORDER — BETAMETHASONE SOD PHOS & ACET 6 (3-3) MG/ML IJ SUSP
12.0000 mg | Freq: Once | INTRAMUSCULAR | Status: AC
  Administered 2012-07-29: 12 mg via INTRAMUSCULAR
  Filled 2012-07-29: qty 2

## 2012-08-01 ENCOUNTER — Inpatient Hospital Stay (HOSPITAL_COMMUNITY)
Admission: AD | Admit: 2012-08-01 | Discharge: 2012-08-02 | Disposition: A | Discharge status: Home / Self Care | Payer: Medicaid Other | Source: Ambulatory Visit | Attending: Obstetrics & Gynecology | Admitting: Obstetrics & Gynecology

## 2012-08-01 ENCOUNTER — Encounter (HOSPITAL_COMMUNITY): Payer: Self-pay | Admitting: *Deleted

## 2012-08-01 DIAGNOSIS — R350 Frequency of micturition: Secondary | ICD-10-CM | POA: Insufficient documentation

## 2012-08-01 DIAGNOSIS — O99891 Other specified diseases and conditions complicating pregnancy: Secondary | ICD-10-CM | POA: Insufficient documentation

## 2012-08-01 DIAGNOSIS — O4700 False labor before 37 completed weeks of gestation, unspecified trimester: Secondary | ICD-10-CM | POA: Insufficient documentation

## 2012-08-01 DIAGNOSIS — O3430 Maternal care for cervical incompetence, unspecified trimester: Secondary | ICD-10-CM | POA: Insufficient documentation

## 2012-08-01 DIAGNOSIS — R109 Unspecified abdominal pain: Secondary | ICD-10-CM | POA: Insufficient documentation

## 2012-08-01 DIAGNOSIS — N949 Unspecified condition associated with female genital organs and menstrual cycle: Secondary | ICD-10-CM

## 2012-08-01 LAB — URINALYSIS, ROUTINE W REFLEX MICROSCOPIC
Bilirubin Urine: NEGATIVE
Glucose, UA: NEGATIVE mg/dL
Hgb urine dipstick: NEGATIVE
Ketones, ur: NEGATIVE mg/dL
pH: 6.5 (ref 5.0–8.0)

## 2012-08-01 LAB — WET PREP, GENITAL
Clue Cells Wet Prep HPF POC: NONE SEEN
Trich, Wet Prep: NONE SEEN

## 2012-08-01 NOTE — MAU Note (Signed)
Pt states she has pain in her lower stomach that comes and goes for the past 2 days-has not gotten any worse

## 2012-08-01 NOTE — MAU Provider Note (Signed)
History     CSN: [id number]  Arrival date and time: 08/01/12 2228   None     Chief Complaint  Patient presents with  . Abdominal Pain   HPI Gabrielle [name] is a 27 y.o. [phone] female at [redacted]w[redacted]d who presents w/ report of lower pelvic pressure w/ occ sharp pains x 2 days.  Reports good fm. Denies lof or vb.  Repeats increased urinary frequency. Denies urinary hesitancy, urgency, or dysuria. Nothing in vagina in the past 24hrs. Onset of care @ 28wks, cx was 1cm/20%, anatomy u/s 1wk later revealed CL=1.7cm w/ dynamic funneling cx. Pt was placed on progesterone suppositories and received bmz x 2.  Last progesterone supp was last night at app 2200.   OB History   Grav Para Term Preterm Abortions TAB SAB Ect Mult Living   4 2 2  1 1    2       Past Medical History  Diagnosis Date  . Asthma   . Breast mass     benign, L breast    Past Surgical History  Procedure Laterality Date  . No past surgeries      Family History  Problem Relation Age of Onset  . Hypertension Mother     History  Substance Use Topics  . Smoking status: Former Smoker    Types: Cigarettes    Quit date: 04/05/2012  . Smokeless tobacco: Never Used  . Alcohol Use: No    Allergies:  Allergies  Allergen Reactions  . Peanut-Containing Drug Products Anaphylaxis    Pecans also  . Shellfish Allergy Anaphylaxis    Prescriptions prior to admission  Medication Sig Dispense Refill  . Prenatal Vit-Min-FA-Fish Oil ([facility] PRENATAL GUMMY PO) Take 2 each by mouth daily.       . progesterone (PROMETRIUM) 200 MG capsule Place 1 capsule (200 mg total) vaginally daily.  30 capsule  1  . progesterone 200 MG SUPP Place 1 suppository (200 mg total) vaginally at bedtime.  30 each  1    Review of Systems  Constitutional: Negative.   HENT: Negative.   Eyes: Negative.   Respiratory: Negative.   Cardiovascular: Negative.   Gastrointestinal: Positive for abdominal pain (lower pelvic pressure w/ occ sharp pains x 2days).   Genitourinary: Positive for frequency. Negative for dysuria, urgency, hematuria and flank pain.  Musculoskeletal: Negative.   Skin: Negative.   Neurological: Negative.   Endo/Heme/Allergies: Negative.   Psychiatric/Behavioral: Negative.    Physical Exam   Blood pressure 118/67, pulse 120, resp. rate 18, height 5\' 4"  (1.626 m), weight 65.318 kg (144 lb), last menstrual period 01/07/2012.  Physical Exam  Constitutional: She is oriented to person, place, and time. She appears well-developed and well-nourished.  HENT:  Head: Normocephalic.  Neck: Normal range of motion.  Cardiovascular: Normal rate.   Respiratory: Effort normal.  GI: Soft.  gravid  Genitourinary: Vagina normal and uterus normal.  Spec exam: cx very posterior, visually closed, fFN, wet prep, and gbs obtained  SVE: 1/th/-3, post, vtx  Musculoskeletal: Normal range of motion.  Neurological: She is alert and oriented to person, place, and time. She has normal reflexes.  Skin: Skin is warm and dry.  Psychiatric: She has a normal mood and affect. Her behavior is normal. Judgment and thought content normal.   FHR: 135, mod variability, 15x15accels, no decels=Cat I UCs: none  MAU Course  Procedures  UA EFM Spec exam w/ fFN and wet prep  SVE  Results for orders placed during the hospital  encounter of 08/01/12 (from the past 24 hour(s))  URINALYSIS, ROUTINE W REFLEX MICROSCOPIC     Status: Abnormal   Collection Time    08/01/12 10:38 PM      Result Value Range   Color, Urine STRAW (*) YELLOW   APPearance CLEAR  CLEAR   Specific Gravity, Urine 1.005  1.005 - 1.030   pH 6.5  5.0 - 8.0   Glucose, UA NEGATIVE  NEGATIVE mg/dL   Hgb urine dipstick NEGATIVE  NEGATIVE   Bilirubin Urine NEGATIVE  NEGATIVE   Ketones, ur NEGATIVE  NEGATIVE mg/dL   Protein, ur NEGATIVE  NEGATIVE mg/dL   Urobilinogen, UA 0.2  0.0 - 1.0 mg/dL   Nitrite NEGATIVE  NEGATIVE   Leukocytes, UA NEGATIVE  NEGATIVE  FETAL FIBRONECTIN      Status: Abnormal   Collection Time    08/01/12 11:35 PM      Result Value Range   Fetal Fibronectin POSITIVE (*) NEGATIVE  WET PREP, GENITAL     Status: Abnormal   Collection Time    08/01/12 11:37 PM      Result Value Range   Yeast Wet Prep HPF POC NONE SEEN  NONE SEEN   Trich, Wet Prep NONE SEEN  NONE SEEN   Clue Cells Wet Prep HPF POC NONE SEEN  NONE SEEN   WBC, Wet Prep HPF POC MANY (*) NONE SEEN    Assessment and Plan  A:  [redacted]w[redacted]d SIUP  [id number]   Known cervical incompetency  +fFN  Cat I FHR  Pelvic pressure  P:  D/C home  To keep appt at clinic on Tue  Increase po fluids  Pelvic rest, no heavy lifting  Reviewed ptl s/s and fetal kick counts  Reviewed reasons to return to hosp  Discussed w/ Dr. Erin Fulling who states OK to d/c home  Marge Duncans 08/01/2012, 11:40 PM

## 2012-08-02 DIAGNOSIS — R350 Frequency of micturition: Secondary | ICD-10-CM

## 2012-08-02 DIAGNOSIS — O3430 Maternal care for cervical incompetence, unspecified trimester: Secondary | ICD-10-CM

## 2012-08-02 DIAGNOSIS — R109 Unspecified abdominal pain: Secondary | ICD-10-CM

## 2012-08-02 NOTE — MAU Provider Note (Signed)
Attestation of Attending Supervision of Advanced Practitioner (CNM/NP): Evaluation and management procedures were performed by the Advanced Practitioner under my supervision and collaboration.  I have reviewed the Advanced Practitioner's note and chart, and I agree with the management and plan.  HARRAWAY-SMITH, Rishav Rockefeller 3:14 AM     

## 2012-08-04 ENCOUNTER — Ambulatory Visit (INDEPENDENT_AMBULATORY_CARE_PROVIDER_SITE_OTHER): Payer: Medicaid Other | Admitting: Obstetrics & Gynecology

## 2012-08-04 ENCOUNTER — Encounter: Payer: Self-pay | Admitting: Obstetrics & Gynecology

## 2012-08-04 VITALS — BP 116/70 | Temp 97.9°F | Wt 146.2 lb

## 2012-08-04 DIAGNOSIS — O0933 Supervision of pregnancy with insufficient antenatal care, third trimester: Secondary | ICD-10-CM

## 2012-08-04 DIAGNOSIS — O0930 Supervision of pregnancy with insufficient antenatal care, unspecified trimester: Secondary | ICD-10-CM

## 2012-08-04 LAB — POCT URINALYSIS DIP (DEVICE)
Bilirubin Urine: NEGATIVE
Glucose, UA: NEGATIVE mg/dL
Leukocytes, UA: NEGATIVE
Nitrite: NEGATIVE

## 2012-08-04 NOTE — Patient Instructions (Signed)

## 2012-08-04 NOTE — Progress Notes (Signed)
Pulse- 100 Patient still reporting vaginal pain and occasional contractions, but denies pain with contractions Review wet prep

## 2012-08-04 NOTE — Progress Notes (Signed)
Occasional BHC, no LOF or bleeding. Using prometrium since 5/27 due to US shows dynamic cervix

## 2012-08-18 ENCOUNTER — Encounter: Payer: Self-pay | Admitting: *Deleted

## 2012-08-18 ENCOUNTER — Ambulatory Visit (INDEPENDENT_AMBULATORY_CARE_PROVIDER_SITE_OTHER): Payer: Medicaid Other | Admitting: Obstetrics & Gynecology

## 2012-08-18 VITALS — BP 111/75 | Temp 97.2°F | Wt 145.2 lb

## 2012-08-18 DIAGNOSIS — O0931 Supervision of pregnancy with insufficient antenatal care, first trimester: Secondary | ICD-10-CM

## 2012-08-18 DIAGNOSIS — O3440 Maternal care for other abnormalities of cervix, unspecified trimester: Secondary | ICD-10-CM

## 2012-08-18 DIAGNOSIS — O3433 Maternal care for cervical incompetence, third trimester: Secondary | ICD-10-CM

## 2012-08-18 DIAGNOSIS — O0930 Supervision of pregnancy with insufficient antenatal care, unspecified trimester: Secondary | ICD-10-CM

## 2012-08-18 LAB — POCT URINALYSIS DIP (DEVICE)
Bilirubin Urine: NEGATIVE
Ketones, ur: NEGATIVE mg/dL
Leukocytes, UA: NEGATIVE
Nitrite: NEGATIVE

## 2012-08-18 NOTE — Patient Instructions (Signed)
Return to clinic for any obstetric concerns or go to MAU for evaluation  

## 2012-08-18 NOTE — Progress Notes (Signed)
Pulse- 96  Edema-feet  Pain/pressure- pelvic   Vaginal discharge- "off white color"

## 2012-08-18 NOTE — Progress Notes (Signed)
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  Pelvic restrictions discussed; preterm labor/FM precautions given. Continue Prometrium as instructed.

## 2012-09-01 ENCOUNTER — Ambulatory Visit (INDEPENDENT_AMBULATORY_CARE_PROVIDER_SITE_OTHER): Payer: Medicaid Other | Admitting: Obstetrics & Gynecology

## 2012-09-01 VITALS — BP 116/77 | Wt 147.7 lb

## 2012-09-01 DIAGNOSIS — O0933 Supervision of pregnancy with insufficient antenatal care, third trimester: Secondary | ICD-10-CM

## 2012-09-01 DIAGNOSIS — J309 Allergic rhinitis, unspecified: Secondary | ICD-10-CM

## 2012-09-01 DIAGNOSIS — O3433 Maternal care for cervical incompetence, third trimester: Secondary | ICD-10-CM

## 2012-09-01 DIAGNOSIS — O3440 Maternal care for other abnormalities of cervix, unspecified trimester: Secondary | ICD-10-CM

## 2012-09-01 DIAGNOSIS — O0930 Supervision of pregnancy with insufficient antenatal care, unspecified trimester: Secondary | ICD-10-CM

## 2012-09-01 LAB — POCT URINALYSIS DIP (DEVICE)
Glucose, UA: NEGATIVE mg/dL
Ketones, ur: NEGATIVE mg/dL
Specific Gravity, Urine: 1.025 (ref 1.005–1.030)
Urobilinogen, UA: 1 mg/dL (ref 0.0–1.0)

## 2012-09-01 MED ORDER — LORATADINE 10 MG PO CAPS: 1.0000 | ORAL_CAPSULE | Freq: Every morning | ORAL | Status: DC

## 2012-09-01 NOTE — Patient Instructions (Addendum)

## 2012-09-01 NOTE — Progress Notes (Signed)
Pulse: 104

## 2012-09-01 NOTE — Progress Notes (Signed)
Allergy symptoms, ok to use Claritin.

## 2012-09-15 ENCOUNTER — Ambulatory Visit (INDEPENDENT_AMBULATORY_CARE_PROVIDER_SITE_OTHER): Payer: Medicaid Other | Admitting: Obstetrics & Gynecology

## 2012-09-15 VITALS — BP 119/68 | Wt 149.4 lb

## 2012-09-15 DIAGNOSIS — O0931 Supervision of pregnancy with insufficient antenatal care, first trimester: Secondary | ICD-10-CM

## 2012-09-15 DIAGNOSIS — O0930 Supervision of pregnancy with insufficient antenatal care, unspecified trimester: Secondary | ICD-10-CM

## 2012-09-15 DIAGNOSIS — O99019 Anemia complicating pregnancy, unspecified trimester: Secondary | ICD-10-CM

## 2012-09-15 DIAGNOSIS — O3433 Maternal care for cervical incompetence, third trimester: Secondary | ICD-10-CM

## 2012-09-15 DIAGNOSIS — O3440 Maternal care for other abnormalities of cervix, unspecified trimester: Secondary | ICD-10-CM

## 2012-09-15 DIAGNOSIS — O99013 Anemia complicating pregnancy, third trimester: Secondary | ICD-10-CM

## 2012-09-15 LAB — POCT URINALYSIS DIP (DEVICE)
Bilirubin Urine: NEGATIVE
Glucose, UA: NEGATIVE mg/dL
Hgb urine dipstick: NEGATIVE
Nitrite: NEGATIVE
Specific Gravity, Urine: 1.02 (ref 1.005–1.030)

## 2012-09-15 LAB — OB RESULTS CONSOLE GC/CHLAMYDIA
Chlamydia: NEGATIVE
Gonorrhea: NEGATIVE

## 2012-09-15 LAB — OB RESULTS CONSOLE GBS: GBS: NEGATIVE

## 2012-09-15 NOTE — Progress Notes (Signed)
Pulse: 100

## 2012-09-15 NOTE — Progress Notes (Signed)
Pelvic cultures done today.  No other complaints or concerns.  Fetal movement and labor precautions reviewed. 

## 2012-09-15 NOTE — Patient Instructions (Signed)
Return to clinic for any obstetric concerns or go to MAU for evaluation  

## 2012-09-16 LAB — GC/CHLAMYDIA PROBE AMP
CT Probe RNA: NEGATIVE
GC Probe RNA: NEGATIVE

## 2012-09-18 ENCOUNTER — Encounter: Payer: Self-pay | Admitting: Obstetrics & Gynecology

## 2012-09-18 LAB — CULTURE, BETA STREP (GROUP B ONLY)

## 2012-09-22 ENCOUNTER — Encounter: Payer: Self-pay | Admitting: Obstetrics and Gynecology

## 2012-09-22 ENCOUNTER — Ambulatory Visit (INDEPENDENT_AMBULATORY_CARE_PROVIDER_SITE_OTHER): Payer: Medicaid Other | Admitting: Obstetrics and Gynecology

## 2012-09-22 VITALS — BP 121/75 | Temp 97.3°F | Wt 151.7 lb

## 2012-09-22 DIAGNOSIS — O99013 Anemia complicating pregnancy, third trimester: Secondary | ICD-10-CM

## 2012-09-22 DIAGNOSIS — O0933 Supervision of pregnancy with insufficient antenatal care, third trimester: Secondary | ICD-10-CM

## 2012-09-22 DIAGNOSIS — O0930 Supervision of pregnancy with insufficient antenatal care, unspecified trimester: Secondary | ICD-10-CM

## 2012-09-22 DIAGNOSIS — O99019 Anemia complicating pregnancy, unspecified trimester: Secondary | ICD-10-CM

## 2012-09-22 LAB — POCT URINALYSIS DIP (DEVICE)
Ketones, ur: NEGATIVE mg/dL
Nitrite: NEGATIVE
Protein, ur: NEGATIVE mg/dL
Urobilinogen, UA: 0.2 mg/dL (ref 0.0–1.0)
pH: 7 (ref 5.0–8.0)

## 2012-09-22 NOTE — Progress Notes (Signed)
P = 85    Pt has questions about birth control methods.

## 2012-09-22 NOTE — Progress Notes (Signed)
Patient doing well without complaints. FM/labor precautions reviewed. Contraception options also reviewed. Patient remains undecided

## 2012-09-22 NOTE — Patient Instructions (Signed)
Contraception Choices Birth control (contraception) can stop pregnancy from happening. Different types of birth control work in different ways. Some can:  Make the mucus in the cervix thick. This makes it hard for sperm to get into the uterus.  Thin the lining of the uterus. This makes it hard for an egg to attach to the wall of the uterus.  Stop the ovaries from releasing an egg.  Block the sperm from reaching the egg. Certain types of surgery can stop pregnancy from happening. For women, the sugery closes the fallopian tubes (tubal ligation). For men, the surgery stops sperm from releasing during sex (vasectomy). HORMONAL BIRTH CONTROL Hormonal birth control stops pregnancy by putting hormones into your body. Types of birth control include:  A small tube put under the skin of the upper arm (implant). The tube can stay in place for 3 years.  Shots given every 3 months.  Pills taken every day or once after sex (intercourse).  Patches that are changed once a week.  A ring put into the vagina (vaginal ring). The ring is left in place for 3 weeks and removed for 1 week. Then, a new ring is put in the vagina. BARRIER BIRTH CONTROL  Barrier birth control blocks sperm from reaching the egg. Types of birth control include:   A thin covering worn on the penis (female condom) during sex.  A soft, loose covering put into the vagina (female condom) before sex.  A rubber bowl that sits over the cervix (diaphragm). The bowl must be made for you. The bowl is put into the vagina before sex. The bowl is left in place for 6 to 8 hours after sex.  A small, soft cup that fits over the cervix (cervical cap). The cup must be made for you. The cup can be left in place for 48 hours after sex.  A sponge that is put into the vagina before sex.  A chemical that kills or blocks sperm from getting into the cervix and uterus (spermicide). The chemical may be a cream, jelly, foam, or pill. INTRAUTERINE (IUD)  BIRTH CONTROL  IUD birth control is a small, T-shaped piece of plastic. The plastic is put inside the uterus. There are 2 types of IUD:  Copper IUD. The IUD is covered in copper wire. The copper makes a fluid that kills sperm. It can stay in place for 10 years.  Hormone IUD. The hormone stops pregnancy from happening. It can stay in place for 5 years. NATURAL FAMILY PLANNING BIRTH CONTROL  Natural family planning means not having sex or using barrier birth control when the woman is fertile. A woman can:  Use a calendar to keep track of when she is fertile.  Use a thermometer to measure her body temperature. Protect yourself against sexual diseases no matter what type of birth control you use. Talk to your doctor about which type of birth control is best for you. Document Released: 12/16/2008 Document Revised: 05/13/2011 Document Reviewed: 06/27/2010 Exit[employer] Patient Information 2014 [employer].  

## 2012-10-02 ENCOUNTER — Ambulatory Visit (INDEPENDENT_AMBULATORY_CARE_PROVIDER_SITE_OTHER): Payer: Self-pay | Admitting: Family Medicine

## 2012-10-02 ENCOUNTER — Encounter: Payer: Self-pay | Admitting: Family Medicine

## 2012-10-02 VITALS — BP 122/77 | Temp 97.2°F | Wt 152.9 lb

## 2012-10-02 DIAGNOSIS — O0930 Supervision of pregnancy with insufficient antenatal care, unspecified trimester: Secondary | ICD-10-CM

## 2012-10-02 DIAGNOSIS — O3440 Maternal care for other abnormalities of cervix, unspecified trimester: Secondary | ICD-10-CM

## 2012-10-02 DIAGNOSIS — O3433 Maternal care for cervical incompetence, third trimester: Secondary | ICD-10-CM

## 2012-10-02 DIAGNOSIS — O0933 Supervision of pregnancy with insufficient antenatal care, third trimester: Secondary | ICD-10-CM

## 2012-10-02 LAB — POCT URINALYSIS DIP (DEVICE)
Bilirubin Urine: NEGATIVE
Ketones, ur: NEGATIVE mg/dL
pH: 6.5 (ref 5.0–8.0)

## 2012-10-02 NOTE — Patient Instructions (Signed)

## 2012-10-02 NOTE — Progress Notes (Signed)
27 yo [id number] @ [redacted]w[redacted]d here for routine OB.  - having some upper rib cage side on right - right where baby is laying  - constant soreness - takes tylenol and it helps - no ctx, LOF, VB. +FM No headaches, vision changes.   O: see flowsheet - tender at the right costochondral angle with immediate reproduction of pain.   A/P 1) costochondral inflammation - recommend regular tylenol at bedtime to help sleep - may try heat for inflammation - reassurance that this is soley an MSK etiology based on exam  2) ROB - GBS and GC/CHL reviewed and negative - labor precautions discussed - urine dip with 1+ protein. Normal pressure, will check UA - kick counts reviewed - f/u in 1 week

## 2012-10-02 NOTE — Progress Notes (Signed)
P=104,   C/o increased pain under right rib cage. Denies headaches, visual changes,

## 2012-10-04 LAB — CULTURE, OB URINE: Colony Count: 30000

## 2012-10-08 ENCOUNTER — Inpatient Hospital Stay (HOSPITAL_COMMUNITY)
Admission: AD | Admit: 2012-10-08 | Discharge: 2012-10-08 | Disposition: A | Discharge status: Home / Self Care | Payer: Medicaid Other | Source: Ambulatory Visit | Attending: Obstetrics & Gynecology | Admitting: Obstetrics & Gynecology

## 2012-10-08 ENCOUNTER — Encounter (HOSPITAL_COMMUNITY): Payer: Self-pay | Admitting: *Deleted

## 2012-10-08 DIAGNOSIS — O4690 Antepartum hemorrhage, unspecified, unspecified trimester: Secondary | ICD-10-CM | POA: Insufficient documentation

## 2012-10-08 DIAGNOSIS — O479 False labor, unspecified: Secondary | ICD-10-CM | POA: Insufficient documentation

## 2012-10-08 DIAGNOSIS — O99013 Anemia complicating pregnancy, third trimester: Secondary | ICD-10-CM

## 2012-10-08 DIAGNOSIS — O0933 Supervision of pregnancy with insufficient antenatal care, third trimester: Secondary | ICD-10-CM

## 2012-10-08 HISTORY — DX: Unspecified infectious disease: B99.9

## 2012-10-08 NOTE — MAU Note (Signed)
Noted bloody mucous this morning.  Having contractions. Was 2 when last checked, high risk due to premature dilation

## 2012-10-09 ENCOUNTER — Encounter (HOSPITAL_COMMUNITY): Payer: Self-pay

## 2012-10-09 ENCOUNTER — Encounter (HOSPITAL_COMMUNITY): Payer: Self-pay | Admitting: Anesthesiology

## 2012-10-09 ENCOUNTER — Inpatient Hospital Stay (HOSPITAL_COMMUNITY): Payer: Medicaid Other | Admitting: Anesthesiology

## 2012-10-09 ENCOUNTER — Inpatient Hospital Stay (HOSPITAL_COMMUNITY)
Admission: AD | Admit: 2012-10-09 | Discharge: 2012-10-11 | DRG: 775 | Disposition: A | Discharge status: Home / Self Care | Payer: Medicaid Other | Source: Ambulatory Visit | Attending: Obstetrics and Gynecology | Admitting: Obstetrics and Gynecology

## 2012-10-09 DIAGNOSIS — O0933 Supervision of pregnancy with insufficient antenatal care, third trimester: Secondary | ICD-10-CM

## 2012-10-09 DIAGNOSIS — O99013 Anemia complicating pregnancy, third trimester: Secondary | ICD-10-CM

## 2012-10-09 LAB — CBC
Hemoglobin: 9.4 g/dL — ABNORMAL LOW (ref 12.0–15.0)
Platelets: 128 10*3/uL — ABNORMAL LOW (ref 150–400)
RBC: 4.5 MIL/uL (ref 3.87–5.11)
WBC: 11.6 10*3/uL — ABNORMAL HIGH (ref 4.0–10.5)

## 2012-10-09 LAB — CBC WITH DIFFERENTIAL/PLATELET
Basophils Absolute: 0 10*3/uL (ref 0.0–0.1)
Basophils Relative: 0 % (ref 0–1)
HCT: 26.1 % — ABNORMAL LOW (ref 36.0–46.0)
MCHC: 30.3 g/dL (ref 30.0–36.0)
Monocytes Absolute: 0.5 10*3/uL (ref 0.1–1.0)
Neutro Abs: 14.5 10*3/uL — ABNORMAL HIGH (ref 1.7–7.7)
Neutrophils Relative %: 91 % — ABNORMAL HIGH (ref 43–77)
Platelets: 129 10*3/uL — ABNORMAL LOW (ref 150–400)
RDW: 17.5 % — ABNORMAL HIGH (ref 11.5–15.5)

## 2012-10-09 LAB — RPR: RPR Ser Ql: NONREACTIVE

## 2012-10-09 MED ORDER — DIPHENHYDRAMINE HCL 50 MG/ML IJ SOLN: 12.5000 mg | INTRAMUSCULAR | Status: DC | PRN

## 2012-10-09 MED ORDER — LACTATED RINGERS IV SOLN: 500.0000 mL | INTRAVENOUS | Status: DC | PRN

## 2012-10-09 MED ORDER — OXYCODONE-ACETAMINOPHEN 5-325 MG PO TABS
1.0000 | ORAL_TABLET | ORAL | Status: DC | PRN
  Administered 2012-10-10 – 2012-10-11 (×3): 1 via ORAL
  Filled 2012-10-09 (×3): qty 1

## 2012-10-09 MED ORDER — ONDANSETRON HCL 4 MG PO TABS: 4.0000 mg | ORAL_TABLET | ORAL | Status: DC | PRN

## 2012-10-09 MED ORDER — LIDOCAINE HCL (PF) 1 % IJ SOLN
30.0000 mL | INTRAMUSCULAR | Status: DC | PRN
  Filled 2012-10-09 (×2): qty 30

## 2012-10-09 MED ORDER — ACETAMINOPHEN 325 MG PO TABS: 650.0000 mg | ORAL_TABLET | ORAL | Status: DC | PRN

## 2012-10-09 MED ORDER — BENZOCAINE-MENTHOL 20-0.5 % EX AERO: 1.0000 "application " | INHALATION_SPRAY | CUTANEOUS | Status: DC | PRN

## 2012-10-09 MED ORDER — ONDANSETRON HCL 4 MG/2ML IJ SOLN: 4.0000 mg | Freq: Four times a day (QID) | INTRAMUSCULAR | Status: DC | PRN

## 2012-10-09 MED ORDER — IBUPROFEN 600 MG PO TABS: 600.0000 mg | ORAL_TABLET | Freq: Four times a day (QID) | ORAL | Status: DC | PRN

## 2012-10-09 MED ORDER — EPHEDRINE 5 MG/ML INJ
10.0000 mg | INTRAVENOUS | Status: DC | PRN
  Filled 2012-10-09: qty 2

## 2012-10-09 MED ORDER — PHENYLEPHRINE 40 MCG/ML (10ML) SYRINGE FOR IV PUSH (FOR BLOOD PRESSURE SUPPORT)
80.0000 ug | PREFILLED_SYRINGE | INTRAVENOUS | Status: DC | PRN
  Filled 2012-10-09: qty 2

## 2012-10-09 MED ORDER — LANOLIN HYDROUS EX OINT: TOPICAL_OINTMENT | CUTANEOUS | Status: DC | PRN

## 2012-10-09 MED ORDER — TETANUS-DIPHTH-ACELL PERTUSSIS 5-2.5-18.5 LF-MCG/0.5 IM SUSP
0.5000 mL | Freq: Once | INTRAMUSCULAR | Status: AC
  Administered 2012-10-10: 0.5 mL via INTRAMUSCULAR

## 2012-10-09 MED ORDER — IBUPROFEN 600 MG PO TABS
600.0000 mg | ORAL_TABLET | Freq: Four times a day (QID) | ORAL | Status: DC
  Administered 2012-10-09 – 2012-10-11 (×7): 600 mg via ORAL
  Filled 2012-10-09 (×7): qty 1

## 2012-10-09 MED ORDER — PNEUMOCOCCAL VAC POLYVALENT 25 MCG/0.5ML IJ INJ
0.5000 mL | INJECTION | INTRAMUSCULAR | Status: AC
  Administered 2012-10-10: 0.5 mL via INTRAMUSCULAR
  Filled 2012-10-09: qty 0.5

## 2012-10-09 MED ORDER — CITRIC ACID-SODIUM CITRATE 334-500 MG/5ML PO SOLN: 30.0000 mL | ORAL | Status: DC | PRN

## 2012-10-09 MED ORDER — WITCH HAZEL-GLYCERIN EX PADS: 1.0000 "application " | MEDICATED_PAD | CUTANEOUS | Status: DC | PRN

## 2012-10-09 MED ORDER — OXYTOCIN 40 UNITS IN LACTATED RINGERS INFUSION - SIMPLE MED
62.5000 mL/h | INTRAVENOUS | Status: DC
  Filled 2012-10-09: qty 1000

## 2012-10-09 MED ORDER — FENTANYL 2.5 MCG/ML BUPIVACAINE 1/10 % EPIDURAL INFUSION (WH - ANES)
14.0000 mL/h | INTRAMUSCULAR | Status: DC | PRN
  Filled 2012-10-09: qty 125

## 2012-10-09 MED ORDER — LACTATED RINGERS IV SOLN
500.0000 mL | Freq: Once | INTRAVENOUS | Status: AC
  Administered 2012-10-09: 500 mL via INTRAVENOUS

## 2012-10-09 MED ORDER — LIDOCAINE HCL (PF) 1 % IJ SOLN
INTRAMUSCULAR | Status: DC | PRN
  Administered 2012-10-09: 7 mL
  Administered 2012-10-09 (×2): 4 mL

## 2012-10-09 MED ORDER — DIPHENHYDRAMINE HCL 25 MG PO CAPS: 25.0000 mg | ORAL_CAPSULE | Freq: Four times a day (QID) | ORAL | Status: DC | PRN

## 2012-10-09 MED ORDER — SENNOSIDES-DOCUSATE SODIUM 8.6-50 MG PO TABS
2.0000 | ORAL_TABLET | Freq: Every day | ORAL | Status: DC
  Administered 2012-10-09 – 2012-10-11 (×2): 2 via ORAL

## 2012-10-09 MED ORDER — FENTANYL 2.5 MCG/ML BUPIVACAINE 1/10 % EPIDURAL INFUSION (WH - ANES)
INTRAMUSCULAR | Status: DC | PRN
  Administered 2012-10-09: 14 mL/h via EPIDURAL

## 2012-10-09 MED ORDER — OXYCODONE-ACETAMINOPHEN 5-325 MG PO TABS: 1.0000 | ORAL_TABLET | ORAL | Status: DC | PRN

## 2012-10-09 MED ORDER — OXYTOCIN BOLUS FROM INFUSION: 500.0000 mL | INTRAVENOUS | Status: DC

## 2012-10-09 MED ORDER — SIMETHICONE 80 MG PO CHEW: 80.0000 mg | CHEWABLE_TABLET | ORAL | Status: DC | PRN

## 2012-10-09 MED ORDER — LACTATED RINGERS IV SOLN
INTRAVENOUS | Status: DC
  Administered 2012-10-09: 19:00:00 via INTRAVENOUS

## 2012-10-09 MED ORDER — PRENATAL MULTIVITAMIN CH
1.0000 | ORAL_TABLET | Freq: Every day | ORAL | Status: DC
  Administered 2012-10-10 – 2012-10-11 (×2): 1 via ORAL
  Filled 2012-10-09: qty 1

## 2012-10-09 MED ORDER — DIBUCAINE 1 % RE OINT: 1.0000 "application " | TOPICAL_OINTMENT | RECTAL | Status: DC | PRN

## 2012-10-09 MED ORDER — ZOLPIDEM TARTRATE 5 MG PO TABS: 5.0000 mg | ORAL_TABLET | Freq: Every evening | ORAL | Status: DC | PRN

## 2012-10-09 MED ORDER — PHENYLEPHRINE 40 MCG/ML (10ML) SYRINGE FOR IV PUSH (FOR BLOOD PRESSURE SUPPORT)
80.0000 ug | PREFILLED_SYRINGE | INTRAVENOUS | Status: DC | PRN
  Filled 2012-10-09: qty 2
  Filled 2012-10-09: qty 5

## 2012-10-09 MED ORDER — EPHEDRINE 5 MG/ML INJ
10.0000 mg | INTRAVENOUS | Status: DC | PRN
  Filled 2012-10-09: qty 4
  Filled 2012-10-09: qty 2

## 2012-10-09 MED ORDER — BUTORPHANOL TARTRATE 1 MG/ML IJ SOLN: 1.0000 mg | INTRAMUSCULAR | Status: DC | PRN

## 2012-10-09 MED ORDER — ONDANSETRON HCL 4 MG/2ML IJ SOLN: 4.0000 mg | INTRAMUSCULAR | Status: DC | PRN

## 2012-10-09 NOTE — Anesthesia Procedure Notes (Signed)
Epidural Patient location during procedure: OB Start time: 10/09/2012 7:48 PM  Staffing Anesthesiologist: Itza Maniaci A. Performed by: anesthesiologist   Preanesthetic Checklist Completed: patient identified, site marked, surgical consent, pre-op evaluation, timeout performed, IV checked, risks and benefits discussed and monitors and equipment checked  Epidural Patient position: sitting Prep: site prepped and draped and DuraPrep Patient monitoring: continuous pulse ox and blood pressure Approach: midline Injection technique: LOR air  Needle:  Needle type: Tuohy  Needle gauge: 17 G Needle length: 9 cm and 9 Needle insertion depth: 4 cm Catheter type: closed end flexible Catheter size: 19 Gauge Catheter at skin depth: 9 cm Test dose: negative  Assessment Events: blood not aspirated, injection not painful, no injection resistance, negative IV test and no paresthesia  Additional Notes Patient identified. Risks and benefits discussed including failed block, incomplete  Pain control, post dural puncture headache, nerve damage, paralysis, blood pressure Changes, nausea, vomiting, reactions to medications-both toxic and allergic and post Partum back pain. All questions were answered. Patient expressed understanding and wished to proceed. Sterile technique was used throughout procedure. Epidural site was Dressed with sterile barrier dressing. No paresthesias, signs of intravascular injection Or signs of intrathecal spread were encountered.  Patient was more comfortable after the epidural was dosed. Please see RN's note for documentation of vital signs and FHR which are stable.

## 2012-10-09 NOTE — Anesthesia Preprocedure Evaluation (Signed)
Anesthesia Evaluation  Patient identified by MRN, date of birth, ID band Patient awake    Reviewed: Allergy & Precautions, H&P , Patient's Chart, lab work & pertinent test results  Airway Mallampati: III TM Distance: >3 FB Neck ROM: full    Dental no notable dental hx. (+) Teeth Intact   Pulmonary neg pulmonary ROS, asthma , Current Smoker,  breath sounds clear to auscultation  Pulmonary exam normal       Cardiovascular negative cardio ROS  Rhythm:regular Rate:Normal     Neuro/Psych negative neurological ROS  negative psych ROS   GI/Hepatic negative GI ROS, Neg liver ROS,   Endo/Other  negative endocrine ROS  Renal/GU negative Renal ROS  negative genitourinary   Musculoskeletal   Abdominal   Peds  Hematology negative hematology ROS (+) anemia ,   Anesthesia Other Findings   Reproductive/Obstetrics (+) Pregnancy                           Anesthesia Physical Anesthesia Plan  ASA: II  Anesthesia Plan: Epidural   Post-op Pain Management:    Induction:   Airway Management Planned:   Additional Equipment:   Intra-op Plan:   Post-operative Plan:   Informed Consent: I have reviewed the patients History and Physical, chart, labs and discussed the procedure including the risks, benefits and alternatives for the proposed anesthesia with the patient or authorized representative who has indicated his/her understanding and acceptance.     Plan Discussed with: Anesthesiologist  Anesthesia Plan Comments:         Anesthesia Quick Evaluation

## 2012-10-09 NOTE — H&P (Signed)
Attestation of Attending Supervision of Advanced Practitioner: Evaluation and management procedures were performed by the PA/NP/CNM/OB Fellow under my supervision/collaboration. Chart reviewed and agree with management and plan.  Ranbir Chew V 10/09/2012 9:17 PM

## 2012-10-09 NOTE — MAU Note (Signed)
Pt noticed scant amount of bleeding yesterday morning. Came to MAU around 10am and was examined. Cervix was 3-4 when checked. Has continued to have scant amount of bleeding today.

## 2012-10-09 NOTE — MAU Note (Signed)
Patient started feeling contractions yesterday starting around 8am and was having around 2 an hour. States this morning they began happening more often and was having around 4 an hour. States contractions are still happening 4 an hour but they have started to become more intense.

## 2012-10-09 NOTE — H&P (Signed)
Gabrielle [name] is a 27 y.o. female [phone] with IUP at [redacted]w[redacted]d by LMP presenting for contractions. Pt states she has been having regular, every 2-6 minutes contractions since last night, associated with none vaginal bleeding.  Membranes are intact, with active fetal movement.  While sitting in the MAU they have seemed to get more painful.    [facility] at [facility] since 28 wks. No complications.  - GBS neg - normal anatomy - normal 1hour - too late for genetic testing.   2 other term deliveries. No complications. Biggest baby 8lb2oz.    Prenatal History/Complications:  Past Medical History: Past Medical History  Diagnosis Date  . Asthma   . Breast mass     benign, L breast  . Infection     UTI    Past Surgical History: Past Surgical History  Procedure Laterality Date  . No past surgeries    . Induced abortion      Obstetrical History: OB History   Grav Para Term Preterm Abortions TAB SAB Ect Mult Living   4 2 2  1 1    2       Social History: History   Social History  . Marital Status: Single    Spouse Name: N/A    Number of Children: N/A  . Years of Education: N/A   Social History Main Topics  . Smoking status: Former Smoker    Types: Cigarettes    Quit date: 04/05/2012  . Smokeless tobacco: Never Used  . Alcohol Use: No  . Drug Use: No  . Sexually Active: Yes     Comment: 5 days ago   Other Topics Concern  . None   Social History Narrative  . None    Family History: Family History  Problem Relation Age of Onset  . Hypertension Mother   . Hypertension Maternal Grandmother     Allergies: Allergies  Allergen Reactions  . Peanut-Containing Drug Products Anaphylaxis    Pecans also  . Shellfish Allergy Anaphylaxis    Prescriptions prior to admission  Medication Sig Dispense Refill  . acetaminophen (TYLENOL) 500 MG tablet Take 1,000 mg by mouth every 6 (six) hours as needed for pain.      . Loratadine 10 MG CAPS Take 1 capsule (10 mg total) by mouth every  morning.  30 each  1  . Prenatal Vit-Min-FA-Fish Oil ([facility] PRENATAL GUMMY PO) Take 2 each by mouth daily.          Review of Systems   Constitutional: Negative for fever, chills, weight loss, malaise/fatigue and diaphoresis.  HENT: Negative for hearing loss, ear pain, nosebleeds, congestion, sore throat, neck pain, tinnitus and ear discharge.   Eyes: Negative for blurred vision, double vision, photophobia, pain, discharge and redness.  Respiratory: Negative for cough, hemoptysis, sputum production, shortness of breath, wheezing and stridor.   Cardiovascular: Negative for chest pain, palpitations, orthopnea,  leg swelling  Gastrointestinal: Positive for abdominal pain. Negative for heartburn, nausea, vomiting, diarrhea, constipation, blood in stool Genitourinary: Negative for dysuria, urgency, frequency, hematuria and flank pain.  Musculoskeletal: Negative for myalgias, back pain, joint pain and falls.  Skin: Negative for itching and rash.  Neurological: Negative for dizziness, tingling, tremors, sensory change, speech change, focal weakness, seizures, loss of consciousness, weakness and headaches.  Endo/Heme/Allergies: Negative for environmental allergies and polydipsia. Does not bruise/bleed easily.  Psychiatric/Behavioral: Negative for depression, suicidal ideas, hallucinations, memory loss and substance abuse. The patient is not nervous/anxious and does not have insomnia.  Blood pressure 131/81, pulse 83, temperature 98.3 F (36.8 C), temperature source Oral, resp. rate 20, height 5' (1.524 m), weight 69.4 kg (153 lb), last menstrual period 01/07/2012. General appearance: alert, cooperative and appears uncomfortable Lungs: clear to auscultation bilaterally Heart: regular rate and rhythm Abdomen: soft, non-tender; bowel sounds normal Extremities: Homans sign is negative, no sign of DVT DTR's 2+ Presentation: cephalic Fetal monitoringBaseline: 130s bpm, Variability: Good {> 6  bpm) and Accelerations: Reactive Uterine activity: every 2-52min  Dilation: 7 Effacement (%): 90 Station: -2 Exam by:: Sarajane Marek, RNC   Prenatal labs: ABO, Rh: A/POS/-- (05/20 1139) Antibody: NEG (05/20 1139) Rubella:   RPR: NON REAC (05/20 1139)  HBsAg: NEGATIVE (05/20 1139)  HIV: NON REACTIVE (05/20 1139)  GBS: Negative (07/15 0000)  1 hr Glucola 81 Genetic screening  Too late Anatomy US normal  .resultrcnt[24h  Assessment: Gabrielle [name] is a 27 y.o. [phone] with an IUP at [redacted]w[redacted]d presenting for active labor  Plan: - admit to L&D - routine orders - epidural prn  2) FWB - cat I tracing - GBS neg - EFW 7.5lbs  3) anticipate SVD.     Vale Haven, MD 10/09/2012, 7:40 PM

## 2012-10-10 NOTE — Progress Notes (Signed)
CSW spoke with MOB about [facility].  No barriers to discharge at this time.  Full consult report to follow, please reconsult CSW if further needs arise.   [id number]

## 2012-10-10 NOTE — Progress Notes (Signed)
Post Partum Day 1 Subjective: no complaints, up ad lib, voiding and tolerating PO, small lochia, plans to breastfeed, undecided about contraception  Objective: Blood pressure 112/67, pulse 78, temperature 98.1 F (36.7 C), temperature source Oral, resp. rate 16, height 5' (1.524 m), weight 69.4 kg (153 lb), last menstrual period 01/07/2012, SpO2 98.00%, unknown if currently breastfeeding.  Physical Exam:  General: alert, cooperative and no distress Lochia:normal flow Chest: CTAB Heart: RRR no m/r/g Abdomen: +BS, soft, nontender,  Uterine Fundus: firm DVT Evaluation: No evidence of DVT seen on physical exam. Extremities: trace edema   Recent Labs  10/09/12 1905 10/09/12 2125  HGB 9.4* 7.9*  HCT 30.6* 26.1*    Assessment/Plan: Plan for discharge tomorrow and Lactation consult   LOS: 1 day   CRESENZO-DISHMAN,Tatum Corl 10/10/2012, 7:53 AM

## 2012-10-10 NOTE — Anesthesia Postprocedure Evaluation (Signed)
  Anesthesia Post-op Note  Patient: Gabrielle [name]  Procedure(s) Performed: * No procedures listed *  Patient Location: PACU and Mother/Baby  Anesthesia Type:Epidural  Level of Consciousness: awake, alert  and oriented  Airway and Oxygen Therapy: Patient Spontanous Breathing  Post-op Pain: mild  Post-op Assessment: Patient's Cardiovascular Status Stable, Respiratory Function Stable, No signs of Nausea or vomiting, Adequate PO intake, Pain level controlled, No headache, No backache, No residual numbness and No residual motor weakness  Post-op Vital Signs: stable  Complications: No apparent anesthesia complications

## 2012-10-11 NOTE — Discharge Summary (Cosign Needed)
Obstetric Discharge Summary Reason for Admission: onset of labor Prenatal Procedures: none Intrapartum Procedures: spontaneous vaginal delivery Postpartum Procedures: none Complications-Operative and Postpartum: none Hemoglobin  Date Value Range Status  10/09/2012 7.9* 12.0 - 15.0 g/dL Final     HCT  Date Value Range Status  10/09/2012 26.1* 36.0 - 46.0 % Final    Physical Exam:  General: alert, cooperative, appears stated age and no distress Lochia: appropriate Uterine Fundus: firm Incision: NA  CTAB no wrc RRR no mgt  DVT Evaluation: No evidence of DVT seen on physical exam. Negative Homan's sign. No cords or calf tenderness. No significant calf/ankle edema.  Discharge Diagnoses: Term Pregnancy-delivered  Discharge Information: Date: 10/11/2012 Activity: unrestricted pelvic rest for 6wk Diet: routine Medications: PNV and Ibuprofen Condition: stable Instructions: refer to practice specific booklet Discharge to: home   Newborn Data: Live born female  Birth Weight: 6 lb 14.4 oz (3130 g) APGAR: 9, 9  Home with mother MOC: Mirena MOF: breast.  Natosha Bou, RYAN 10/11/2012, 7:46 AM

## 2012-10-13 NOTE — Progress Notes (Signed)
Post discharge chart review completed.  

## 2012-11-18 ENCOUNTER — Encounter: Payer: Self-pay | Admitting: *Deleted

## 2012-11-18 ENCOUNTER — Ambulatory Visit (INDEPENDENT_AMBULATORY_CARE_PROVIDER_SITE_OTHER): Payer: Medicaid Other | Admitting: Obstetrics & Gynecology

## 2012-11-18 ENCOUNTER — Encounter: Payer: Self-pay | Admitting: Obstetrics & Gynecology

## 2012-11-18 VITALS — BP 121/79 | HR 111 | Ht 64.0 in | Wt 138.0 lb

## 2012-11-18 DIAGNOSIS — Z30431 Encounter for routine checking of intrauterine contraceptive device: Secondary | ICD-10-CM

## 2012-11-18 DIAGNOSIS — Z3049 Encounter for surveillance of other contraceptives: Secondary | ICD-10-CM

## 2012-11-18 MED ORDER — LEVONORGESTREL 20 MCG/24HR IU IUD
INTRAUTERINE_SYSTEM | Freq: Once | INTRAUTERINE | Status: AC
  Administered 2012-11-18: 14:00:00 via INTRAUTERINE

## 2012-11-18 NOTE — Procedures (Signed)
Patient identified, informed consent performed, signed copy in chart, time out was performed.  Urine pregnancy test negative.  Speculum placed in the vagina.  Cervix visualized.  Cleaned with Betadine x 2.  Grasped anteriourly with a single tooth tenaculum.  Uterus sounded to 8 cm.  Mirena IUD placed per manufacturer's recommendations.  Strings trimmed to 3 cm.   Patient given post procedure instructions and Mirena care card with expiration date.  Patient is asked to check IUD strings periodically and follow up in 4-6 weeks for IUD check.  Adam Phenix, MD 11/18/2012

## 2012-11-18 NOTE — Patient Instructions (Signed)
Intrauterine Device Insertion Care After Refer to this sheet in the next few weeks. These instructions provide you with information on caring for yourself after your procedure. Your caregiver may also give you more specific instructions. Your treatment has been planned according to current medical practices, but problems sometimes occur. Call your caregiver if you have any problems or questions after your procedure. HOME CARE INSTRUCTIONS   Only take over-the-counter or prescription medicines for pain, discomfort, or fever as directed by your caregiver. Do not use aspirin. This may increase bleeding.  Check your IUD to make sure it is in place before you resume sexual activity. You should be able to feel the strings. If you cannot feel the strings, something may be wrong. The IUD may have fallen out of the uterus, or the uterus may have been punctured (perforated) during placement. Also, if the strings are getting longer, it may mean that the IUD is being forced out of the uterus. You no longer have full protection from pregnancy if any of these problems occur.  You may resume sexual intercourse if you are not having problems with the IUD. The IUD is considered immediately effective.  You may resume normal activities.  Keep all follow-up appointments to be sure your IUD has remained in place. After the first exam, yearly exams are advised, unless you cannot feel the strings of your IUD.  Continue to check that the IUD is still in place by feeling for the strings after every menstrual period. SEEK MEDICAL CARE IF:   You have bleeding that is heavier or lasts longer than a normal menstrual cycle.  You have a fever.  You have increasing cramps or abdominal pain not relieved with medicine.  You have abdominal pain that does not seem to be related to the same area of earlier cramping and pain.  You are lightheaded, unusually weak, or faint.  You have abnormal vaginal discharge or  smells.  You have pain during sexual intercourse.  You cannot feel the IUD strings, or the IUD string has gotten longer.  You feel the IUD at the opening of the cervix in the vagina.  You think you are pregnant, or you miss your menstrual period.  The IUD string is hurting your sex partner. Document Released: 10/17/2010 Document Revised: 05/13/2011 Document Reviewed: 10/17/2010 [employer] Patient Information 2014 [employer]  

## 2012-11-18 NOTE — Progress Notes (Unsigned)
Patient ID: Gabrielle [name], female   DOB: 07/15/1985, 27 y.o.   MRN: [id number] Subjective:     Gabrielle [name] is a 27 y.o. female who presents for a postpartum visit. She is 6 weeks postpartum following a spontaneous vaginal delivery. I have fully reviewed the prenatal and intrapartum course. The delivery was at 39.3 gestational weeks. Outcome: spontaneous vaginal delivery. Anesthesia: epidural. Postpartum course has been good. Baby's course has been normal. Baby is feeding by breast. Bleeding staining only. Bowel function is normal. Bladder function is normal. Patient is not sexually active. Contraception method is IUD and wants this today. Postpartum depression screening: negative.  The following portions of the patient's history were reviewed and updated as appropriate: allergies, current medications, past family history, past medical history, past social history, past surgical history and problem list.  Review of Systems Pertinent items are noted in HPI.   Objective:    BP 121/79  Pulse 111  Ht 5\' 4"  (1.626 m)  Wt 138 lb (62.596 kg)  BMI 23.68 kg/m2  Breastfeeding? Yes  General:  alert, cooperative and no distress   Breasts:     Lungs:    Heart:     Abdomen: soft, non-tender; bowel sounds normal; no masses,  no organomegaly   Vulva:  normal  Vagina: normal vagina  Cervix:  no lesions  Corpus: normal  Adnexa:  not evaluated  Rectal Exam: Not performed.        Assessment:   normal postpartum exam. Pap smear not done at today's visit.   Plan:    1. Contraception: IUD 2. Mirena today 3. Follow up in: 4 weeks or as needed.   Adam Phenix, MD 11/18/2012

## 2012-11-19 ENCOUNTER — Encounter: Payer: Self-pay | Admitting: Obstetrics & Gynecology

## 2014-01-03 ENCOUNTER — Encounter: Payer: Self-pay | Admitting: Obstetrics & Gynecology

## 2014-10-19 IMAGING — US US OB DETAIL+14 WK
2 series · 12 of 28 positions shown · non-contrast
Comparison: none

[Series 1: us ob detail +14 wk · 9 of 79 slices shown (1 of 2)]
[im 4/79]
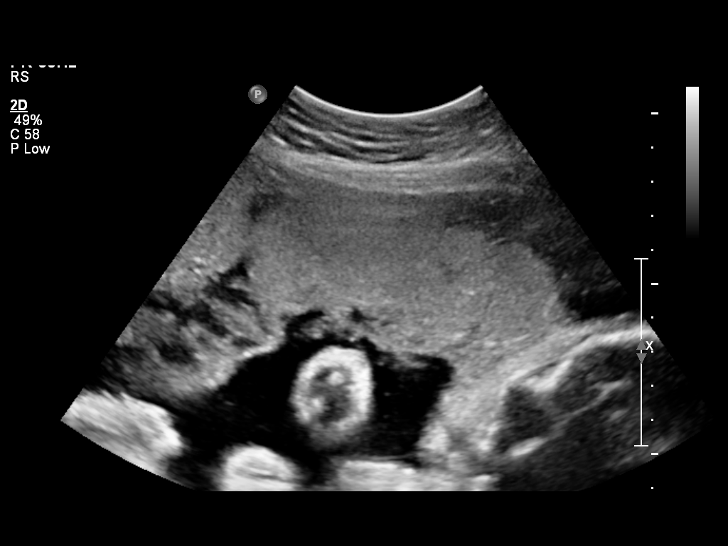
[im 12/79]
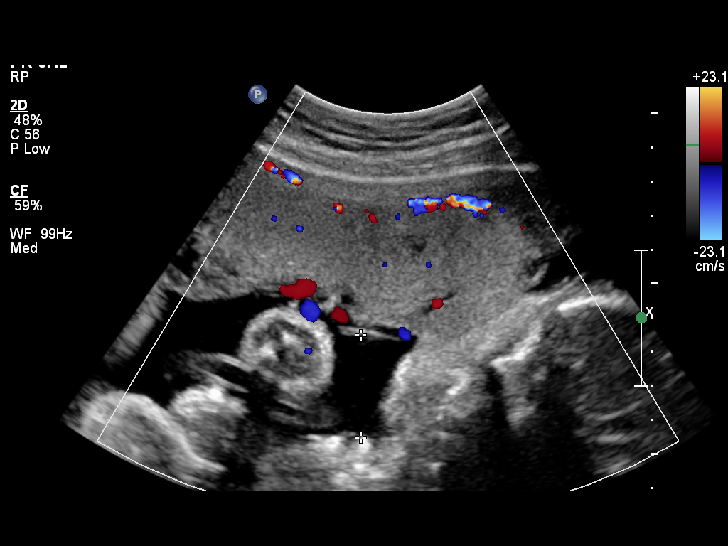
[im 19/79]
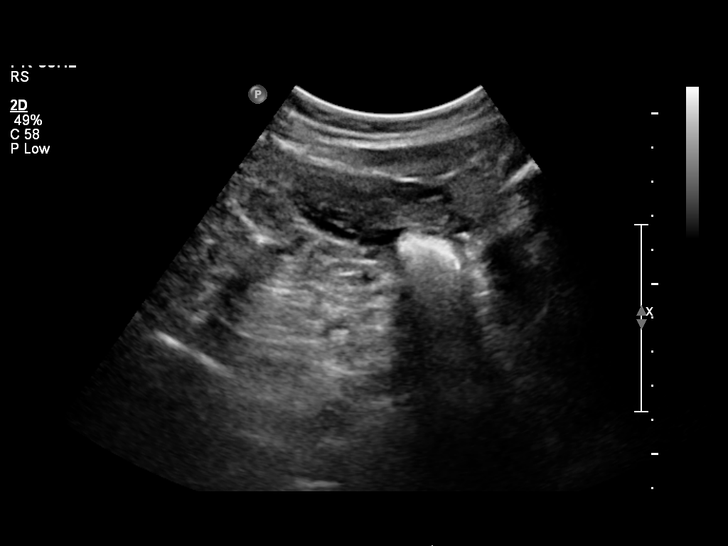
[im 30/79]
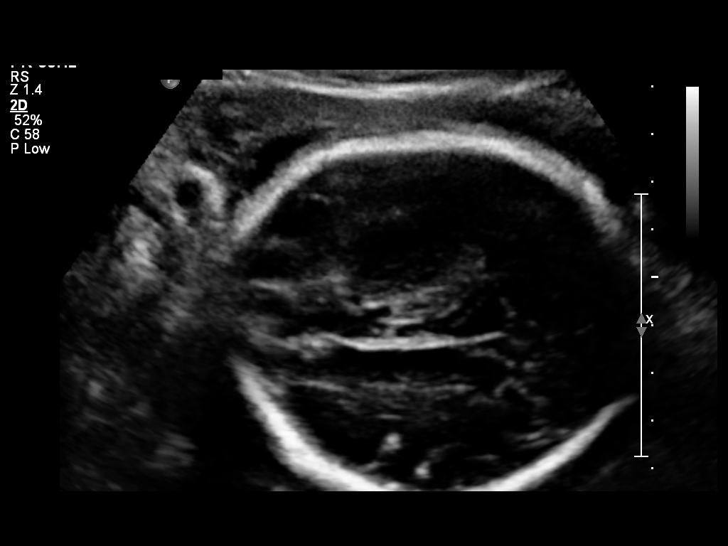
[im 38/79]
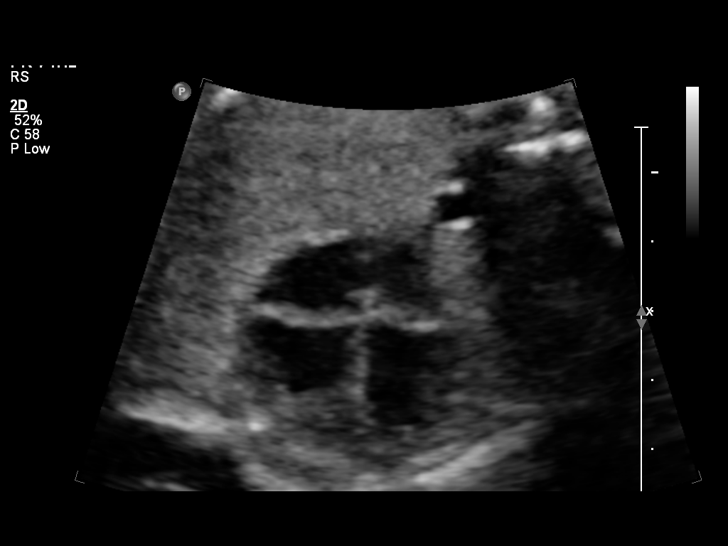
[im 45/79]
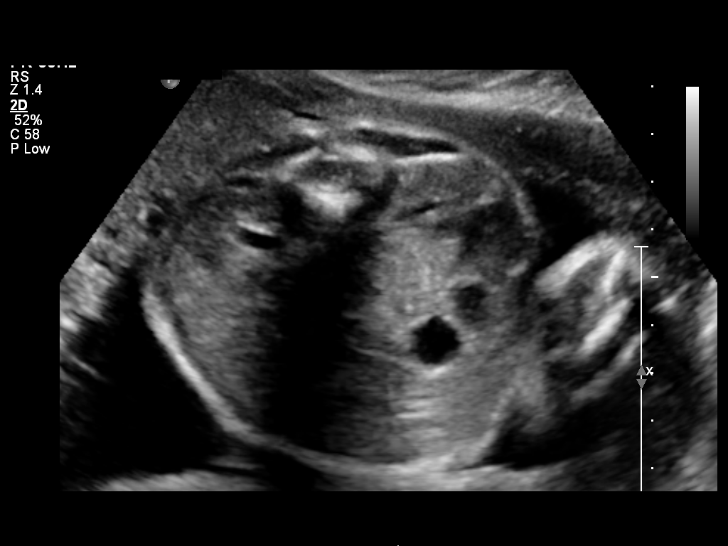
[im 56/79]
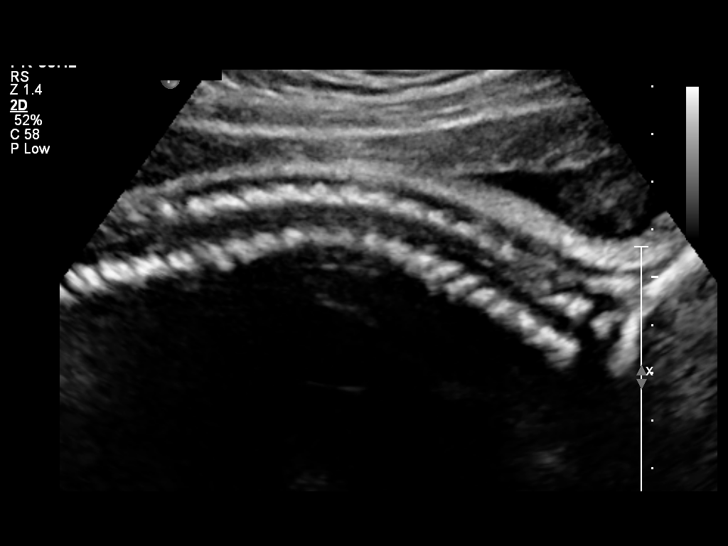
[im 64/79]
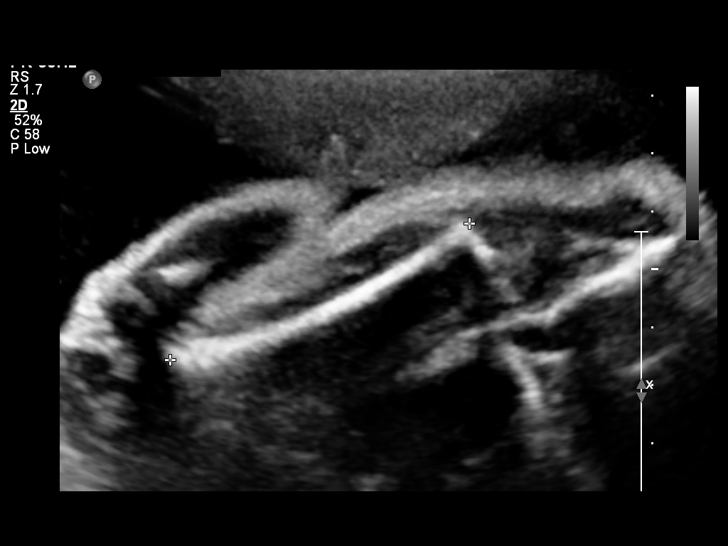
[im 71/79]
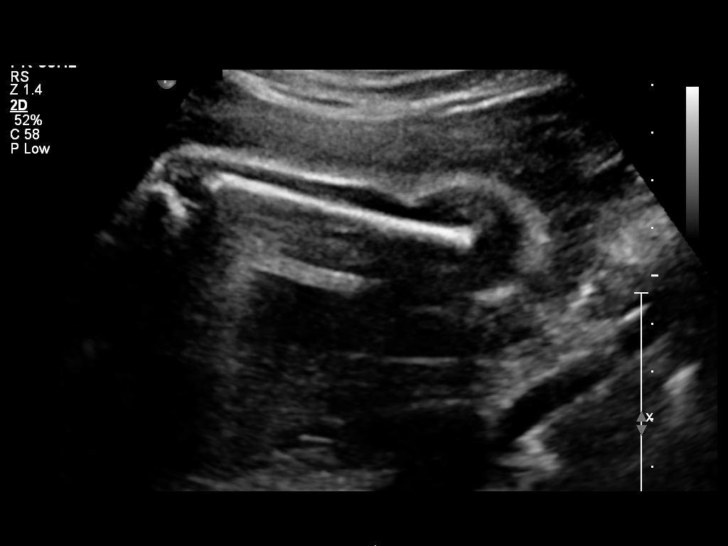

[Series 1: us ob detail +14 wk · 23 acquisitions, 3 frames shown (2 of 2)]
[im 1/23]
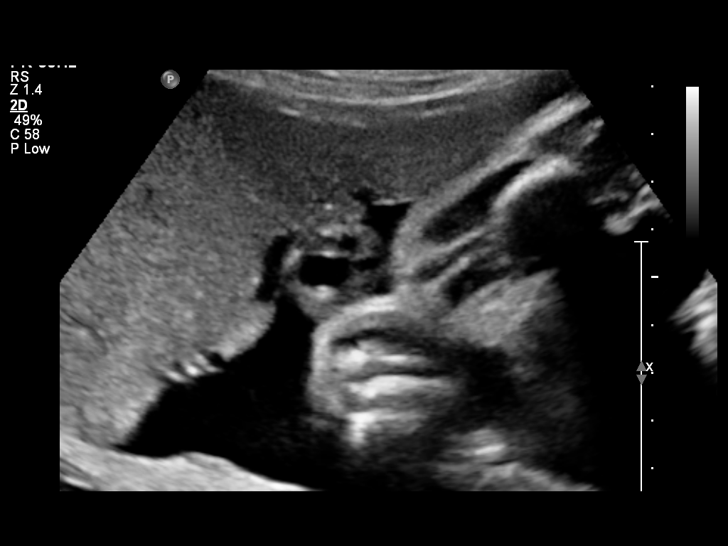
[im 9/23]
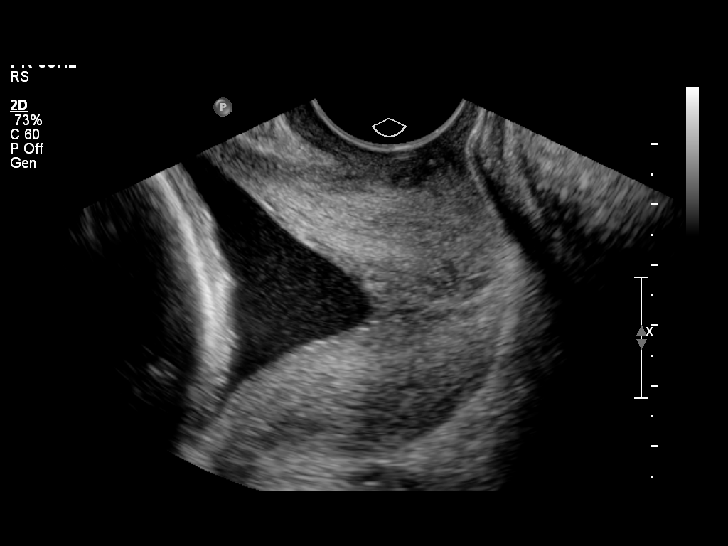
[im 18/23]
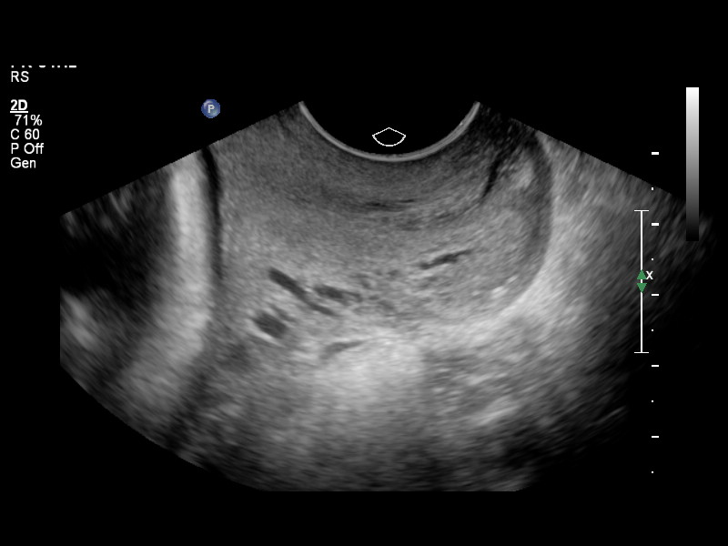

[12 of 28 positions shown; findings below may reference images not displayed]

OBSTETRICS REPORT
                      (Signed Final 07/28/2012 [DATE])

Service(s) Provided

 US OB DETAIL + 14 WK                                  76811.0
 US OB TRANSVAGINAL                                    76817.0
Indications

 No or Little Prenatal Care
 Detailed fetal anatomic survey
Fetal Evaluation

 Num Of Fetuses:    1
 Fetal Heart Rate:  146                         bpm
 Cardiac Activity:  Observed
 Presentation:      Cephalic
 Placenta:          Anterior, above cervical os
 P. Cord            Visualized
 Insertion:

 Amniotic Fluid
 AFI FV:      Subjectively within normal limits
 AFI Sum:     12.22   cm      31   %Tile     Larg Pckt:   4.43   cm
 RUQ:   4.43   cm    RLQ:    2.99   cm    LUQ:   2.33    cm   LLQ:    2.47   cm
Biometry

 BPD:     74.4  mm    G. Age:   29w 6d                CI:        70.71   70 - 86
                                                      FL/HC:      20.2   19.6 -

 HC:       282  mm    G. Age:   30w 6d       74  %    HC/AC:      1.09   0.99 -

 AC:     258.8  mm    G. Age:   30w 1d       74  %    FL/BPD:     76.6   71 - 87
 FL:        57  mm    G. Age:   29w 6d       61  %    FL/AC:      22.0   20 - 24
 HUM:     54.1  mm    G. Age:   31w 3d     > 95  %
 Est. FW:    1336  gm      3 lb 5 oz     72  %
Gestational Age

 LMP:           29w 0d       Date:   01/07/12                 EDD:   10/13/12
 Clinical EDD:  29w 0d                                        EDD:   10/13/12
 U/S Today:     30w 1d                                        EDD:   10/05/12
 Best:          29w 0d    Det. By:   Clinical EDD             EDD:   10/13/12
Anatomy

 Cranium:          Appears normal         Aortic Arch:      Appears normal
 Fetal Cavum:      Appears normal         Ductal Arch:      Not well visualized
 Ventricles:       Appears normal         Diaphragm:        Appears normal
 Choroid Plexus:   Appears normal         Stomach:          Appears normal, left
                                                            sided
 Cerebellum:       Appears normal         Abdomen:          Appears normal
 Posterior Fossa:  Appears normal         Abdominal Wall:   Not well visualized
 Nuchal Fold:      Not applicable (>20    Cord Vessels:     Appears normal (3
                   wks GA)                                  vessel cord)
 Face:             Orbits appear          Kidneys:          Appear normal
                   normal
 Lips:             Appears normal         Bladder:          Appears normal
 Heart:            Appears normal         Spine:            Appears normal
                   (4CH, axis, and
                   situs)
 RVOT:             Not well visualized    Lower             Appears normal
                                          Extremities:
 LVOT:             Not well visualized    Upper             Appears normal
                                          Extremities:

 Other:  Fetus appears to be a male. Heels visualized. Technically difficult due
         to advanced gestational age.
Targeted Anatomy

 Fetal Central Nervous System
 Lat. Ventricles:
Cervix Uterus Adnexa

 Cervical Length:   1.7       cm       Funnel Width:   2.4       cm
 Funnel Length:     3         cm

 Cervix:       Dynamic cervix noted ranging from 1.7cm to 2.9cm
 Left Ovary:   Size(cm) L: 3.29 x W: 3.07 x H: 1.77  Volume(cc):
 Right Ovary:  Not visualized.
 Adnexa:     No abnormality visualized.
Comments

 Because of today's cervical findings, this report was called
 and the patient was taken to [facility] for further evaluation as
 requested.
Impression

 Siup demonstrating an EGA by ultrasound of 30w 1d. This
 corresponds well with expected EGA by cliical EDD of 29w 0d.

 Overall anatomic evaluation is compromised by advanced
 gestational age and fetal position. Visualized anatomy
 appears normal and no focal placental abnormalities are
 seen. No soft markers for Down Syndrome are seen
 associated with visualized anatomy.Correlation with other
 aneuploidy screening results, if available, would be
 recommended for a more complete risk assessment.

 Subjectively and quantitatively normal amniotic fluid volume.

 Dynamic cervical shortening with associated funneling as
 noted above

 questions or concerns.

## 2015-03-04 ENCOUNTER — Emergency Department (HOSPITAL_COMMUNITY)
Admission: EM | Admit: 2015-03-04 | Discharge: 2015-03-04 | Disposition: A | Discharge status: Home / Self Care | Payer: Medicaid Other | Attending: Emergency Medicine | Admitting: Emergency Medicine

## 2015-03-04 ENCOUNTER — Encounter (HOSPITAL_COMMUNITY): Payer: Self-pay

## 2015-03-04 ENCOUNTER — Emergency Department (HOSPITAL_COMMUNITY): Payer: Medicaid Other

## 2015-03-04 DIAGNOSIS — F17210 Nicotine dependence, cigarettes, uncomplicated: Secondary | ICD-10-CM | POA: Insufficient documentation

## 2015-03-04 DIAGNOSIS — R0602 Shortness of breath: Secondary | ICD-10-CM

## 2015-03-04 DIAGNOSIS — Z8742 Personal history of other diseases of the female genital tract: Secondary | ICD-10-CM | POA: Insufficient documentation

## 2015-03-04 DIAGNOSIS — J4521 Mild intermittent asthma with (acute) exacerbation: Secondary | ICD-10-CM | POA: Insufficient documentation

## 2015-03-04 DIAGNOSIS — J4520 Mild intermittent asthma, uncomplicated: Secondary | ICD-10-CM

## 2015-03-04 DIAGNOSIS — Z87440 Personal history of urinary (tract) infections: Secondary | ICD-10-CM | POA: Insufficient documentation

## 2015-03-04 DIAGNOSIS — R05 Cough: Secondary | ICD-10-CM

## 2015-03-04 DIAGNOSIS — J069 Acute upper respiratory infection, unspecified: Secondary | ICD-10-CM | POA: Insufficient documentation

## 2015-03-04 DIAGNOSIS — M545 Low back pain: Secondary | ICD-10-CM | POA: Insufficient documentation

## 2015-03-04 DIAGNOSIS — Z3202 Encounter for pregnancy test, result negative: Secondary | ICD-10-CM | POA: Insufficient documentation

## 2015-03-04 DIAGNOSIS — M546 Pain in thoracic spine: Secondary | ICD-10-CM | POA: Insufficient documentation

## 2015-03-04 DIAGNOSIS — R059 Cough, unspecified: Secondary | ICD-10-CM

## 2015-03-04 LAB — POC URINE PREG, ED: Preg Test, Ur: NEGATIVE

## 2015-03-04 MED ORDER — PREDNISONE 20 MG PO TABS
60.0000 mg | ORAL_TABLET | Freq: Once | ORAL | Status: AC
  Administered 2015-03-04: 60 mg via ORAL
  Filled 2015-03-04: qty 3

## 2015-03-04 MED ORDER — KETOROLAC TROMETHAMINE 60 MG/2ML IM SOLN
60.0000 mg | Freq: Once | INTRAMUSCULAR | Status: AC
  Administered 2015-03-04: 60 mg via INTRAMUSCULAR
  Filled 2015-03-04: qty 2

## 2015-03-04 MED ORDER — ALBUTEROL SULFATE HFA 108 (90 BASE) MCG/ACT IN AERS
2.0000 | INHALATION_SPRAY | Freq: Once | RESPIRATORY_TRACT | Status: AC
  Administered 2015-03-04: 2 via RESPIRATORY_TRACT
  Filled 2015-03-04: qty 6.7

## 2015-03-04 MED ORDER — PREDNISONE 10 MG PO TABS: 50.0000 mg | ORAL_TABLET | Freq: Every day | ORAL | Status: DC

## 2015-03-04 NOTE — ED Notes (Addendum)
Onset 2 days ago non productive cough, onset last night shortness of breath - took 2 Albuterol neb treatments with no relief.  Onset today nasal congestion and low back pain.  Pt talking in complete sentences, resp u/e.

## 2015-03-04 NOTE — Discharge Instructions (Signed)
Asthma, Adult °Asthma is a recurring condition in which the airways tighten and narrow. Asthma can make it difficult to breathe. It can cause coughing, wheezing, and shortness of breath. Asthma episodes, also called asthma attacks, range from minor to life-threatening. Asthma cannot be cured, but medicines and lifestyle changes can help control it. °CAUSES °Asthma is believed to be caused by inherited (genetic) and environmental factors, but its exact cause is unknown. Asthma may be triggered by allergens, lung infections, or irritants in the air. Asthma triggers are different for each person. Common triggers include:  °· Animal dander. °· Dust mites. °· Cockroaches. °· Pollen from trees or grass. °· Mold. °· Smoke. °· Air pollutants such as dust, household cleaners, hair sprays, aerosol sprays, paint fumes, strong chemicals, or strong odors. °· Cold air, weather changes, and winds (which increase molds and pollens in the air). °· Strong emotional expressions such as crying or laughing hard. °· Stress. °· Certain medicines (such as aspirin) or types of drugs (such as beta-blockers). °· Sulfites in foods and drinks. Foods and drinks that may contain sulfites include dried fruit, potato chips, and sparkling grape juice. °· Infections or inflammatory conditions such as the flu, a cold, or an inflammation of the nasal membranes (rhinitis). °· Gastroesophageal reflux disease (GERD). °· Exercise or strenuous activity. °SYMPTOMS °Symptoms may occur immediately after asthma is triggered or many hours later. Symptoms include: °· Wheezing. °· Excessive nighttime or early morning coughing. °· Frequent or severe coughing with a common cold. °· Chest tightness. °· Shortness of breath. °DIAGNOSIS  °The diagnosis of asthma is made by a review of your medical history and a physical exam. Tests may also be performed. These may include: °· Lung function studies. These tests show how much air you breathe in and out. °· Allergy  tests. °· Imaging tests such as X-rays. °TREATMENT  °Asthma cannot be cured, but it can usually be controlled. Treatment involves identifying and avoiding your asthma triggers. It also involves medicines. There are 2 classes of medicine used for asthma treatment:  °· Controller medicines. These prevent asthma symptoms from occurring. They are usually taken every day. °· Reliever or rescue medicines. These quickly relieve asthma symptoms. They are used as needed and provide short-term relief. °Your health care provider will help you create an asthma action plan. An asthma action plan is a written plan for managing and treating your asthma attacks. It includes a list of your asthma triggers and how they may be avoided. It also includes information on when medicines should be taken and when their dosage should be changed. An action plan may also involve the use of a device called a peak flow meter. A peak flow meter measures how well the lungs are working. It helps you monitor your condition. °HOME CARE INSTRUCTIONS  °· Take medicines only as directed by your health care provider. Speak with your health care provider if you have questions about how or when to take the medicines. °· Use a peak flow meter as directed by your health care provider. Record and keep track of readings. °· Understand and use the action plan to help minimize or stop an asthma attack without needing to seek medical care. °· Control your home environment in the following ways to help prevent asthma attacks: °· Do not smoke. Avoid being exposed to secondhand smoke. °· Change your heating and air conditioning filter regularly. °· Limit your use of fireplaces and wood stoves. °· Get rid of pests (such as roaches   and mice) and their droppings.  Throw away plants if you see mold on them.  Clean your floors and dust regularly. Use unscented cleaning products.  Try to have someone else vacuum for you regularly. Stay out of rooms while they are  being vacuumed and for a short while afterward. If you vacuum, use a dust mask from a hardware store, a double-layered or microfilter vacuum cleaner bag, or a vacuum cleaner with a HEPA filter.  Replace carpet with wood, tile, or vinyl flooring. Carpet can trap dander and dust.  Use allergy-proof pillows, mattress covers, and box spring covers.  Wash bed sheets and blankets every week in hot water and dry them in a dryer.  Use blankets that are made of polyester or cotton.  Clean bathrooms and kitchens with bleach. If possible, have someone repaint the walls in these rooms with mold-resistant paint. Keep out of the rooms that are being cleaned and painted.  Wash hands frequently. SEEK MEDICAL CARE IF:   You have wheezing, shortness of breath, or a cough even if taking medicine to prevent attacks.  The colored mucus you cough up (sputum) is thicker than usual.  Your sputum changes from clear or white to yellow, green, gray, or bloody.  You have any problems that may be related to the medicines you are taking (such as a rash, itching, swelling, or trouble breathing).  You are using a reliever medicine more than 2-3 times per week.  Your peak flow is still at 50-79% of your personal best after following your action plan for 1 hour.  You have a fever. SEEK IMMEDIATE MEDICAL CARE IF:   You seem to be getting worse and are unresponsive to treatment during an asthma attack.  You are short of breath even at rest.  You get short of breath when doing very little physical activity.  You have difficulty eating, drinking, or talking due to asthma symptoms.  You develop chest pain.  You develop a fast heartbeat.  You have a bluish color to your lips or fingernails.  You are light-headed, dizzy, or faint.  Your peak flow is less than 50% of your personal best.   This information is not intended to replace advice given to you by your health care provider. Make sure you discuss any  questions you have with your health care provider.   Document Released: 02/18/2005 Document Revised: 11/09/2014 Document Reviewed: 09/17/2012 Elsevier Interactive Patient Education 2016 [employer] Inc[employer]  Cough, Adult A cough helps to clear your throat and lungs. A cough may last only 2-3 weeks (acute), or it may last longer than 8 weeks (chronic). Many different things can cause a cough. A cough may be a sign of an illness or another medical condition. HOME CARE  Pay attention to any changes in your cough.  Take medicines only as told by your doctor.  If you were prescribed an antibiotic medicine, take it as told by your doctor. Do not stop taking it even if you start to feel better.  Talk with your doctor before you try using a cough medicine.  Drink enough fluid to keep your pee (urine) clear or pale yellow.  If the air is dry, use a cold steam vaporizer or humidifier in your home.  Stay away from things that make you cough at work or at home.  If your cough is worse at night, try using extra pillows to raise your head up higher while you sleep.  Do not smoke, and try not  to be around smoke. If you need help quitting, ask your doctor.  Do not have caffeine.  Do not drink alcohol.  Rest as needed. GET HELP IF:  You have new problems (symptoms).  You cough up yellow fluid (pus).  Your cough does not get better after 2-3 weeks, or your cough gets worse.  Medicine does not help your cough and you are not sleeping well.  You have pain that gets worse or pain that is not helped with medicine.  You have a fever.  You are losing weight and you do not know why.  You have night sweats. GET HELP RIGHT AWAY IF:  You cough up blood.  You have trouble breathing.  Your heartbeat is very fast.   This information is not intended to replace advice given to you by your health care provider. Make sure you discuss any questions you have with your health care provider.   Document  Released: 11/01/2010 Document Revised: 11/09/2014 Document Reviewed: 04/27/2014 Elsevier Interactive Patient Education 2016 [employer] Inc[employer]  Metered Dose Inhaler (No Spacer Used) Inhaled medicines are the basis of treatment for asthma and other breathing problems. Inhaled medicine can only be effective if used properly. Good technique assures that the medicine reaches the lungs. Metered dose inhalers (MDIs) are used to deliver a variety of inhaled medicines. These include quick relief or rescue medicines (such as bronchodilators) and controller medicines (such as corticosteroids). The medicine is delivered by pushing down on a metal canister to release a set amount of spray. If you are using different kinds of inhalers, use your quick relief medicine to open the airways 10-15 minutes before using a steroid, if instructed to do so by your health care provider. If you are unsure which inhalers to use and the order of using them, ask your health care provider, nurse, or respiratory therapist. HOW TO USE THE INHALER  Remove the cap from the inhaler.  If you are using the inhaler for the first time, you will need to prime it. Shake the inhaler for 5 seconds and release four puffs into the air, away from your face. Ask your health care provider or pharmacist if you have questions about priming your inhaler.  Shake the inhaler for 5 seconds before each breath in (inhalation).  Position the inhaler so that the top of the canister faces up.  Put your index finger on the top of the medicine canister. Your thumb supports the bottom of the inhaler.  Open your mouth.  Either place the inhaler between your teeth and place your lips tightly around the mouthpiece, or hold the inhaler 1-2 inches away from your open mouth. If you are unsure of which technique to use, ask your health care provider.  Breathe out (exhale) normally and as completely as possible.  Press the canister down with the index finger to  release the medicine.  At the same time as the canister is pressed, inhale deeply and slowly until your lungs are completely filled. This should take 4-6 seconds. Keep your tongue down.  Hold the medicine in your lungs for 5-10 seconds (10 seconds is best). This helps the medicine get into the small airways of your lungs.  Breathe out slowly, through pursed lips. Whistling is an example of pursed lips.  Wait at least 1 minute between puffs. Continue with the above steps until you have taken the number of puffs your health care provider has ordered. Do not use the inhaler more than your health care provider  directs you to.  Replace the cap on the inhaler.  Follow the directions from your health care provider or the inhaler insert for cleaning the inhaler. If you are using a steroid inhaler, after your last puff, rinse your mouth with water, gargle, and spit out the water. Do not swallow the water. AVOID:  Inhaling before or after starting the spray of medicine. It takes practice to coordinate your breathing with triggering the spray.  Inhaling through the nose (rather than the mouth) when triggering the spray. HOW TO DETERMINE IF YOUR INHALER IS FULL OR NEARLY EMPTY You cannot know when an inhaler is empty by shaking it. Some inhalers are now being made with dose counters. Ask your health care provider for a prescription that has a dose counter if you feel you need that extra help. If your inhaler does not have a counter, ask your health care provider to help you determine the date you need to refill your inhaler. Write the refill date on a calendar or your inhaler canister. Refill your inhaler 7-10 days before it runs out. Be sure to keep an adequate supply of medicine. This includes making sure it has not expired, and making sure you have a spare inhaler. SEEK MEDICAL CARE IF:  Symptoms are only partially relieved with your inhaler.  You are having trouble using your inhaler.  You  experience an increase in phlegm. SEEK IMMEDIATE MEDICAL CARE IF:  You feel little or no relief with your inhalers. You are still wheezing and feeling shortness of breath, tightness in your chest, or both.  You have dizziness, headaches, or a fast heart rate.  You have chills, fever, or night sweats.  There is a noticeable increase in phlegm production, or there is blood in the phlegm. MAKE SURE YOU:  Understand these instructions.  Will watch your condition.  Will get help right away if you are not doing well or get worse.   This information is not intended to replace advice given to you by your health care provider. Make sure you discuss any questions you have with your health care provider.   Document Released: 12/16/2006 Document Revised: 03/11/2014 Document Reviewed: 08/06/2012 Elsevier Interactive Patient Education 2016 Elsevier Inc.  Viral Infections A virus is a type of germ. Viruses can cause:  Minor sore throats.  Aches and pains.  Headaches.  Runny nose.  Rashes.  Watery eyes.  Tiredness.  Coughs.  Loss of appetite.  Feeling sick to your stomach (nausea).  Throwing up (vomiting).  Watery poop (diarrhea). HOME CARE   Only take medicines as told by your doctor.  Drink enough water and fluids to keep your pee (urine) clear or pale yellow. Sports drinks are a good choice.  Get plenty of rest and eat healthy. Soups and broths with crackers or rice are fine. GET HELP RIGHT AWAY IF:   You have a very bad headache.  You have shortness of breath.  You have chest pain or neck pain.  You have an unusual rash.  You cannot stop throwing up.  You have watery poop that does not stop.  You cannot keep fluids down.  You or your child has a temperature by mouth above 102 F (38.9 C), not controlled by medicine.  Your baby is older than 3 months with a rectal temperature of 102 F (38.9 C) or higher.  Your baby is 95 months old or younger with a  rectal temperature of 100.4 F (38 C) or higher. MAKE SURE YOU:  Understand these instructions.  Will watch this condition.  Will get help right away if you are not doing well or get worse.   This information is not intended to replace advice given to you by your health care provider. Make sure you discuss any questions you have with your health care provider.   Document Released: 02/01/2008 Document Revised: 05/13/2011 Document Reviewed: 07/27/2014 Elsevier Interactive Patient Education [employer] Elsevier Inc.

## 2015-03-04 NOTE — ED Provider Notes (Signed)
CSN: [id number]     Arrival date & time 03/04/15  1411 History   First MD Initiated Contact with Patient 03/04/15 1644     Chief Complaint  Patient presents with  . Cough  . Shortness of Breath     (Consider location/radiation/quality/duration/timing/severity/associated sxs/prior Treatment) Patient is a 29 y.o. female presenting with cough and shortness of breath. The history is provided by the patient.  Cough Cough characteristics:  Productive Sputum characteristics:  Green and clear Severity:  Moderate Onset quality:  Gradual Duration:  2 days Timing:  Intermittent Progression:  Waxing and waning Chronicity:  New Smoker: no   Context: not sick contacts   Relieved by:  Nothing Worsened by:  Deep breathing Ineffective treatments:  Rest Associated symptoms: chills, fever (Pt reports fever of 102 at home), myalgias, rhinorrhea, shortness of breath and sinus congestion   Associated symptoms: no chest pain, no diaphoresis, no ear fullness, no ear pain, no eye discharge, no headaches, no rash, no sore throat, no weight loss and no wheezing   Risk factors: no recent infection and no recent travel   Shortness of Breath Associated symptoms: cough and fever (Pt reports fever of 102 at home)   Associated symptoms: no abdominal pain, no chest pain, no diaphoresis, no ear pain, no headaches, no neck pain, no rash, no sore throat, no vomiting and no wheezing     Past Medical History  Diagnosis Date  . Asthma   . Breast mass     benign, L breast  . Infection     UTI   Past Surgical History  Procedure Laterality Date  . No past surgeries    . Induced abortion     Family History  Problem Relation Age of Onset  . Hypertension Mother   . Hypertension Maternal Grandmother    Social History  Substance Use Topics  . Smoking status: Current Every Day Smoker -- 0.30 packs/day    Types: Cigarettes  . Smokeless tobacco: Never Used  . Alcohol Use: Yes     Comment: occ   OB History     Gravida Para Term Preterm AB TAB SAB Ectopic Multiple Living   Review of Systems  Constitutional: Positive for fever (Pt reports fever of 102 at home) and chills. Negative for weight loss and diaphoresis.  HENT: Positive for rhinorrhea. Negative for ear pain and sore throat.   Eyes: Negative for pain and discharge.  Respiratory: Positive for cough and shortness of breath. Negative for chest tightness and wheezing.   Cardiovascular: Negative for chest pain.  Gastrointestinal: Negative for nausea, vomiting, abdominal pain, diarrhea and constipation.  Genitourinary: Negative for dysuria.  Musculoskeletal: Positive for myalgias and back pain. Negative for joint swelling, arthralgias, gait problem, neck pain and neck stiffness.  Skin: Negative for rash.  Neurological: Negative for dizziness and headaches.  Psychiatric/Behavioral: Negative for confusion.      Allergies  Peanut-containing drug products and Shellfish allergy  Home Medications   Prior to Admission medications   Medication Sig Start Date End Date Taking? Authorizing Provider  acetaminophen (TYLENOL) 500 MG tablet Take 1,000 mg by mouth every 6 (six) hours as needed for pain.   Yes Historical Provider, MD  DM-Doxylamine-Acetaminophen (NIGHT TIME COLD/FLU RELIEF PO) Take 15 mLs by mouth daily as needed. For cold and flu symptoms   Yes Historical Provider, MD  Loratadine 10 MG CAPS Take 1 capsule (10 mg total) by  mouth every morning. Patient not taking: Reported on 03/04/2015 09/01/12   Adam PhenixJames G Arnold, MD  predniSONE (DELTASONE) 10 MG tablet Take 5 tablets (50 mg total) by mouth daily. 03/04/15   Stacy GardnerAndrew Pheobe Sandiford, MD   BP 125/77 mmHg  Pulse 117  Temp(Src) 99.1 F (37.3 C) (Oral)  Resp 16  Ht 5\' 5"  (1.651 m)  Wt 58.514 kg  BMI 21.47 kg/m2  SpO2 99%  LMP 02/12/2015 Physical Exam  Constitutional: She is oriented to person, place, and time. She appears well-developed and well-nourished. No distress.   HENT:  Head: Normocephalic and atraumatic.  Eyes: EOM are normal. Pupils are equal, round, and reactive to light.  Neck: Normal range of motion.  Cardiovascular: Normal rate, regular rhythm and normal heart sounds.  Exam reveals no gallop and no friction rub.   No murmur heard. Pulmonary/Chest: Effort normal and breath sounds normal. No respiratory distress. She has no wheezes. She has no rales. She exhibits no tenderness.  Abdominal: Soft. Bowel sounds are normal. She exhibits no distension and no mass. There is no tenderness. There is no rebound and no guarding.  Musculoskeletal:       Thoracic back: She exhibits pain. She exhibits normal range of motion, no tenderness, no bony tenderness, no swelling, no edema, no deformity and no laceration.       Lumbar back: She exhibits pain. She exhibits normal range of motion, no tenderness, no bony tenderness, no swelling, no edema, no deformity and no laceration.  Neurological: She is alert and oriented to person, place, and time. No cranial nerve deficit. Coordination normal.  Skin: Skin is warm and dry. No rash noted. She is not diaphoretic. No erythema. No pallor.  Psychiatric: She has a normal mood and affect. Her behavior is normal.    ED Course  Procedures (including critical care time) Labs Review Labs Reviewed  POC URINE PREG, ED    Imaging Review Dg Chest 2 View  03/04/2015  CLINICAL DATA:  Patient shortness of breath for multiple days. Lower back pain. EXAM: CHEST  2 VIEW COMPARISON:  None. FINDINGS: Normal cardiac and mediastinal contours. No consolidative pulmonary opacities. No pleural effusion or pneumothorax. Regional skeleton is unremarkable. IMPRESSION: No active cardiopulmonary disease. Electronically Signed   By: Annia Beltrew  Davis M.D.   On: 03/04/2015 18:32   I have personally reviewed and evaluated these images and lab results as part of my medical decision-making.   EKG Interpretation None      MDM   Final diagnoses:   Cough  SOB (shortness of breath)  Viral URI  Reactive airway disease, mild intermittent, uncomplicated    29 year old African-American female with no significant past medical history presents in setting of cough, shortness of breath, myalgias. Patient reports proximal to 2 days ago she started having cough with intermittent production of greenish sputum. She reports yesterday she had some mild shortness of breath and a fever of 102 at home. She reports that time due to continued coughing she has some mild low back pain. Patient reports due to continued symptoms she presented to emergency department. On arrival patient was hemodynamically stable and afebrile. Patient's lungs were clear to auscultation bilaterally. She did have dry hacking cough on examination. Patient's heart rate was regular rate and rhythm. She denies headaches or vision change, neck stiffness, recent sick contacts, recent travel, chest pain, nausea, vomiting. She reports last menstrual period was 3 weeks ago.  In setting of patient's symptoms we'll obtain chest x-ray to further evaluate for  pneumonia. With this is likely upper respiratory infection versus bronchitis. Additionally patient given albuterol inhaler treatment and pain medication. We'll reassess after imaging. Pregnancy test negative.  Chest x-ray did not reveal acute pneumonia. Patient reported symptom improvement with albuterol. Additionally patient given oral prednisone. Patient continued to maintain oxygen saturations within normal limits on room air. This is likely viral upper respiratory infection with reactive airway disease. Will discharge patient home with short course of steroids and albuterol inhaler as needed. Advised patient to follow up PCP in 2-3 days for reevaluation. Patient stable at time of discharge and given strict return precautions. Patient in agreement with plan.  Attending has seen and evaluated patient and Dr. Ranae Palms is in agreement with  plan.  Stacy Gardner, MD 03/04/15 Izell [place]t  Loren Racer, MD 03/11/15 2124

## 2019-03-11 ENCOUNTER — Other Ambulatory Visit: Payer: Self-pay | Admitting: Obstetrics & Gynecology

## 2019-03-19 ENCOUNTER — Other Ambulatory Visit: Payer: Self-pay

## 2019-03-19 ENCOUNTER — Encounter (HOSPITAL_BASED_OUTPATIENT_CLINIC_OR_DEPARTMENT_OTHER): Payer: Self-pay | Admitting: Obstetrics & Gynecology

## 2019-03-19 NOTE — Progress Notes (Signed)
Spoke w/ via phone for pre-op interview---Gabrielle [name] needs dos---cbc, urine preg-              [name] results------ COVID test ------[phone] Arrive at -------1100 am 03-22-2018 NPO after ------midnight food, clear liquids until 700 am then npo Medications to take morning of surgery -----buspar, clairtin, albuterol inhaler prn/bring inhaler Diabetic medication -----n/a Patient Special Instructions ----- Pre-Op special Istructions ----- Patient verbalized understanding of instructions that were given at this phone interview. Patient denies shortness of breath, chest pain, fever, cough a this phone interview.

## 2019-03-20 ENCOUNTER — Other Ambulatory Visit (HOSPITAL_COMMUNITY)
Admission: RE | Admit: 2019-03-20 | Discharge: 2019-03-20 | Disposition: A | Discharge status: Home / Self Care | Payer: BC Managed Care – PPO | Source: Ambulatory Visit | Attending: Obstetrics & Gynecology | Admitting: Obstetrics & Gynecology

## 2019-03-20 DIAGNOSIS — Z01812 Encounter for preprocedural laboratory examination: Secondary | ICD-10-CM | POA: Insufficient documentation

## 2019-03-20 DIAGNOSIS — Z20822 Contact with and (suspected) exposure to covid-19: Secondary | ICD-10-CM | POA: Insufficient documentation

## 2019-03-20 LAB — SARS CORONAVIRUS 2 (TAT 6-24 HRS): SARS Coronavirus 2: NEGATIVE

## 2019-03-23 ENCOUNTER — Other Ambulatory Visit: Payer: Self-pay

## 2019-03-23 ENCOUNTER — Ambulatory Visit (HOSPITAL_BASED_OUTPATIENT_CLINIC_OR_DEPARTMENT_OTHER)
Admission: RE | Admit: 2019-03-23 | Discharge: 2019-03-23 | Disposition: A | Discharge status: Home / Self Care | Payer: BC Managed Care – PPO | Attending: Obstetrics & Gynecology | Admitting: Obstetrics & Gynecology

## 2019-03-23 ENCOUNTER — Ambulatory Visit (HOSPITAL_BASED_OUTPATIENT_CLINIC_OR_DEPARTMENT_OTHER): Payer: BC Managed Care – PPO | Admitting: Anesthesiology

## 2019-03-23 ENCOUNTER — Other Ambulatory Visit (HOSPITAL_COMMUNITY): Payer: Self-pay

## 2019-03-23 ENCOUNTER — Encounter (HOSPITAL_BASED_OUTPATIENT_CLINIC_OR_DEPARTMENT_OTHER)
Admission: RE | Disposition: A | Discharge status: Home / Self Care | Payer: Self-pay | Source: Home / Self Care | Attending: Obstetrics & Gynecology

## 2019-03-23 ENCOUNTER — Encounter (HOSPITAL_BASED_OUTPATIENT_CLINIC_OR_DEPARTMENT_OTHER): Payer: Self-pay | Admitting: Obstetrics & Gynecology

## 2019-03-23 DIAGNOSIS — Z793 Long term (current) use of hormonal contraceptives: Secondary | ICD-10-CM | POA: Diagnosis not present

## 2019-03-23 DIAGNOSIS — N736 Female pelvic peritoneal adhesions (postinfective): Secondary | ICD-10-CM | POA: Diagnosis not present

## 2019-03-23 DIAGNOSIS — Z79899 Other long term (current) drug therapy: Secondary | ICD-10-CM | POA: Diagnosis not present

## 2019-03-23 DIAGNOSIS — N801 Endometriosis of ovary: Secondary | ICD-10-CM

## 2019-03-23 DIAGNOSIS — N83202 Unspecified ovarian cyst, left side: Secondary | ICD-10-CM

## 2019-03-23 DIAGNOSIS — Z113 Encounter for screening for infections with a predominantly sexual mode of transmission: Secondary | ICD-10-CM | POA: Insufficient documentation

## 2019-03-23 DIAGNOSIS — J45909 Unspecified asthma, uncomplicated: Secondary | ICD-10-CM | POA: Insufficient documentation

## 2019-03-23 DIAGNOSIS — Z8249 Family history of ischemic heart disease and other diseases of the circulatory system: Secondary | ICD-10-CM | POA: Insufficient documentation

## 2019-03-23 DIAGNOSIS — Z975 Presence of (intrauterine) contraceptive device: Secondary | ICD-10-CM | POA: Diagnosis not present

## 2019-03-23 DIAGNOSIS — N80129 Deep endometriosis of ovary, unspecified ovary: Secondary | ICD-10-CM

## 2019-03-23 DIAGNOSIS — N83201 Unspecified ovarian cyst, right side: Secondary | ICD-10-CM

## 2019-03-23 DIAGNOSIS — N7011 Chronic salpingitis: Secondary | ICD-10-CM | POA: Clinically undetermined

## 2019-03-23 DIAGNOSIS — F17210 Nicotine dependence, cigarettes, uncomplicated: Secondary | ICD-10-CM | POA: Diagnosis not present

## 2019-03-23 HISTORY — DX: Unspecified ovarian cyst, left side: N83.201

## 2019-03-23 HISTORY — DX: Unspecified ovarian cyst, left side: N83.202

## 2019-03-23 HISTORY — PX: ROBOTIC ASSISTED LAPAROSCOPIC OVARIAN CYSTECTOMY: SHX6081

## 2019-03-23 HISTORY — DX: Endometriosis of ovary: N80.1

## 2019-03-23 HISTORY — DX: Deep endometriosis of ovary, unspecified ovary: N80.129

## 2019-03-23 HISTORY — DX: Dorsalgia, unspecified: M54.9

## 2019-03-23 LAB — CBC
HCT: 39.3 % (ref 36.0–46.0)
Hemoglobin: 13.3 g/dL (ref 12.0–15.0)
MCH: 30.7 pg (ref 26.0–34.0)
MCHC: 33.8 g/dL (ref 30.0–36.0)
MCV: 90.8 fL (ref 80.0–100.0)
Platelets: 172 10*3/uL (ref 150–400)
RBC: 4.33 MIL/uL (ref 3.87–5.11)
RDW: 12.5 % (ref 11.5–15.5)
WBC: 8.1 10*3/uL (ref 4.0–10.5)
nRBC: 0 % (ref 0.0–0.2)

## 2019-03-23 LAB — POCT PREGNANCY, URINE: Preg Test, Ur: NEGATIVE

## 2019-03-23 SURGERY — EXCISION, CYST, OVARY, ROBOT-ASSISTED, LAPAROSCOPIC
Anesthesia: General

## 2019-03-23 MED ORDER — KETOROLAC TROMETHAMINE 30 MG/ML IJ SOLN
INTRAMUSCULAR | Status: DC | PRN
  Administered 2019-03-23: 30 mg via INTRAVENOUS

## 2019-03-23 MED ORDER — HYDROMORPHONE HCL 1 MG/ML IJ SOLN
0.2500 mg | INTRAMUSCULAR | Status: DC | PRN
  Filled 2019-03-23: qty 0.5

## 2019-03-23 MED ORDER — PROPOFOL 10 MG/ML IV BOLUS
INTRAVENOUS | Status: DC | PRN
  Administered 2019-03-23: 150 mg via INTRAVENOUS

## 2019-03-23 MED ORDER — FENTANYL CITRATE (PF) 100 MCG/2ML IJ SOLN
25.0000 ug | INTRAMUSCULAR | Status: DC | PRN
  Administered 2019-03-23: 25 ug via INTRAVENOUS
  Filled 2019-03-23: qty 1

## 2019-03-23 MED ORDER — ACETAMINOPHEN 500 MG PO TABS
1000.0000 mg | ORAL_TABLET | Freq: Once | ORAL | Status: AC
  Administered 2019-03-23: 1000 mg via ORAL
  Filled 2019-03-23: qty 2

## 2019-03-23 MED ORDER — LIDOCAINE 2% (20 MG/ML) 5 ML SYRINGE
INTRAMUSCULAR | Status: DC | PRN
  Administered 2019-03-23: 60 mg via INTRAVENOUS

## 2019-03-23 MED ORDER — CEFAZOLIN SODIUM-DEXTROSE 2-4 GM/100ML-% IV SOLN
2.0000 g | INTRAVENOUS | Status: AC
  Administered 2019-03-23: 2 g via INTRAVENOUS
  Filled 2019-03-23: qty 100

## 2019-03-23 MED ORDER — DEXAMETHASONE SODIUM PHOSPHATE 10 MG/ML IJ SOLN
INTRAMUSCULAR | Status: DC | PRN
  Administered 2019-03-23: 10 mg via INTRAVENOUS

## 2019-03-23 MED ORDER — KETAMINE HCL 10 MG/ML IJ SOLN
INTRAMUSCULAR | Status: AC
  Filled 2019-03-23: qty 1

## 2019-03-23 MED ORDER — KETAMINE HCL 10 MG/ML IJ SOLN
INTRAMUSCULAR | Status: DC | PRN
  Administered 2019-03-23: 25 mg via INTRAVENOUS

## 2019-03-23 MED ORDER — IBUPROFEN 200 MG PO TABS: 600.0000 mg | ORAL_TABLET | Freq: Four times a day (QID) | ORAL | 0 refills | Status: AC | PRN

## 2019-03-23 MED ORDER — ROCURONIUM BROMIDE 10 MG/ML (PF) SYRINGE
PREFILLED_SYRINGE | INTRAVENOUS | Status: DC | PRN
  Administered 2019-03-23: 40 mg via INTRAVENOUS
  Administered 2019-03-23: 20 mg via INTRAVENOUS

## 2019-03-23 MED ORDER — SUGAMMADEX SODIUM 200 MG/2ML IV SOLN
INTRAVENOUS | Status: DC | PRN
  Administered 2019-03-23: 240 mg via INTRAVENOUS
  Administered 2019-03-23: 160 mg via INTRAVENOUS

## 2019-03-23 MED ORDER — MIDAZOLAM HCL 2 MG/2ML IJ SOLN
INTRAMUSCULAR | Status: DC | PRN
  Administered 2019-03-23: 2 mg via INTRAVENOUS

## 2019-03-23 MED ORDER — METHYLENE BLUE 0.5 % INJ SOLN
INTRAVENOUS | Status: DC | PRN
  Administered 2019-03-23: 2 mL

## 2019-03-23 MED ORDER — LIDOCAINE 2% (20 MG/ML) 5 ML SYRINGE
INTRAMUSCULAR | Status: DC | PRN
  Administered 2019-03-23: 1.5 mg/kg/h via INTRAVENOUS

## 2019-03-23 MED ORDER — ONDANSETRON HCL 4 MG/2ML IJ SOLN
INTRAMUSCULAR | Status: AC
  Filled 2019-03-23: qty 2

## 2019-03-23 MED ORDER — CEFAZOLIN SODIUM-DEXTROSE 2-4 GM/100ML-% IV SOLN
INTRAVENOUS | Status: AC
  Filled 2019-03-23: qty 100

## 2019-03-23 MED ORDER — LIDOCAINE HCL 2 % IJ SOLN
INTRAMUSCULAR | Status: AC
  Filled 2019-03-23: qty 20

## 2019-03-23 MED ORDER — SUGAMMADEX SODIUM 500 MG/5ML IV SOLN
INTRAVENOUS | Status: AC
  Filled 2019-03-23: qty 5

## 2019-03-23 MED ORDER — PROPOFOL 10 MG/ML IV BOLUS
INTRAVENOUS | Status: AC
  Filled 2019-03-23: qty 20

## 2019-03-23 MED ORDER — OXYCODONE HCL 5 MG PO TABS: 5.0000 mg | ORAL_TABLET | Freq: Four times a day (QID) | ORAL | 0 refills | Status: AC | PRN

## 2019-03-23 MED ORDER — ONDANSETRON HCL 4 MG/2ML IJ SOLN
INTRAMUSCULAR | Status: DC | PRN
  Administered 2019-03-23 (×2): 4 mg via INTRAVENOUS

## 2019-03-23 MED ORDER — FENTANYL CITRATE (PF) 100 MCG/2ML IJ SOLN
INTRAMUSCULAR | Status: AC
  Filled 2019-03-23: qty 2

## 2019-03-23 MED ORDER — BUPIVACAINE HCL 0.25 % IJ SOLN
INTRAMUSCULAR | Status: DC | PRN
  Administered 2019-03-23: 30 mL

## 2019-03-23 MED ORDER — ACETAMINOPHEN 500 MG PO TABS
ORAL_TABLET | ORAL | Status: AC
  Filled 2019-03-23: qty 2

## 2019-03-23 MED ORDER — SODIUM CHLORIDE 0.9 % IR SOLN
Status: DC | PRN
  Administered 2019-03-23: 1000 mL

## 2019-03-23 MED ORDER — FENTANYL CITRATE (PF) 250 MCG/5ML IJ SOLN
INTRAMUSCULAR | Status: AC
  Filled 2019-03-23: qty 5

## 2019-03-23 MED ORDER — FENTANYL CITRATE (PF) 100 MCG/2ML IJ SOLN
INTRAMUSCULAR | Status: DC | PRN
  Administered 2019-03-23: 100 ug via INTRAVENOUS
  Administered 2019-03-23 (×2): 50 ug via INTRAVENOUS

## 2019-03-23 MED ORDER — LACTATED RINGERS IV SOLN
INTRAVENOUS | Status: DC
  Filled 2019-03-23: qty 1000

## 2019-03-23 MED ORDER — PROMETHAZINE HCL 25 MG/ML IJ SOLN
6.2500 mg | INTRAMUSCULAR | Status: DC | PRN
  Filled 2019-03-23: qty 1

## 2019-03-23 MED ORDER — MIDAZOLAM HCL 2 MG/2ML IJ SOLN
INTRAMUSCULAR | Status: AC
  Filled 2019-03-23: qty 2

## 2019-03-23 SURGICAL SUPPLY — 60 items
ADH SKN CLS APL DERMABOND .7 (GAUZE/BANDAGES/DRESSINGS) ×1
APL SRG 38 LTWT LNG FL B (MISCELLANEOUS)
APPLICATOR ARISTA FLEXITIP XL (MISCELLANEOUS) IMPLANT
BARRIER ADHS 3X4 INTERCEED (GAUZE/BANDAGES/DRESSINGS) ×2 IMPLANT
BRR ADH 4X3 ABS CNTRL BYND (GAUZE/BANDAGES/DRESSINGS) ×1
CATH FOLEY 3WAY  5CC 16FR (CATHETERS) ×2
CATH FOLEY 3WAY 5CC 16FR (CATHETERS) ×1 IMPLANT
COVER BACK TABLE 60X90IN (DRAPES) ×3 IMPLANT
COVER TIP SHEARS 8 DVNC (MISCELLANEOUS) ×1 IMPLANT
COVER TIP SHEARS 8MM DA VINCI (MISCELLANEOUS) ×2
DECANTER SPIKE VIAL GLASS SM (MISCELLANEOUS) ×6 IMPLANT
DEFOGGER SCOPE WARMER CLEARIFY (MISCELLANEOUS) ×3 IMPLANT
DERMABOND ADVANCED (GAUZE/BANDAGES/DRESSINGS) ×2
DERMABOND ADVANCED .7 DNX12 (GAUZE/BANDAGES/DRESSINGS) ×1 IMPLANT
DRAPE ARM DVNC X/XI (DISPOSABLE) ×4 IMPLANT
DRAPE COLUMN DVNC XI (DISPOSABLE) ×1 IMPLANT
DRAPE DA VINCI XI ARM (DISPOSABLE) ×8
DRAPE DA VINCI XI COLUMN (DISPOSABLE) ×2
DURAPREP 26ML APPLICATOR (WOUND CARE) ×3 IMPLANT
ELECT REM PT RETURN 9FT ADLT (ELECTROSURGICAL) ×3
ELECTRODE REM PT RTRN 9FT ADLT (ELECTROSURGICAL) ×1 IMPLANT
GAUZE 4X4 16PLY RFD (DISPOSABLE) ×3 IMPLANT
GAUZE VASELINE 1X8 (GAUZE/BANDAGES/DRESSINGS) IMPLANT
GLOVE BIO SURGEON STRL SZ 6.5 (GLOVE) IMPLANT
GLOVE BIO SURGEON STRL SZ7 (GLOVE) ×9 IMPLANT
GLOVE BIO SURGEONS STRL SZ 6.5 (GLOVE)
GLOVE BIOGEL PI IND STRL 7.0 (GLOVE) ×5 IMPLANT
GLOVE BIOGEL PI INDICATOR 7.0 (GLOVE) ×10
HEMOSTAT ARISTA ABSORB 3G PWDR (HEMOSTASIS) IMPLANT
IRRIG SUCT STRYKERFLOW 2 WTIP (MISCELLANEOUS) ×3
IRRIGATION SUCT STRKRFLW 2 WTP (MISCELLANEOUS) ×1 IMPLANT
LEGGING LITHOTOMY PAIR STRL (DRAPES) ×3 IMPLANT
OBTURATOR OPTICAL STANDARD 8MM (TROCAR) ×2
OBTURATOR OPTICAL STND 8 DVNC (TROCAR) ×1
OBTURATOR OPTICALSTD 8 DVNC (TROCAR) ×1 IMPLANT
OCCLUDER COLPOPNEUMO (BALLOONS) ×3 IMPLANT
PACK ROBOT WH (CUSTOM PROCEDURE TRAY) ×3 IMPLANT
PACK ROBOTIC GOWN (GOWN DISPOSABLE) ×3 IMPLANT
PACK TRENDGUARD 450 HYBRID PRO (MISCELLANEOUS) ×1 IMPLANT
PAD PREP 24X48 CUFFED NSTRL (MISCELLANEOUS) ×3 IMPLANT
PROTECTOR NERVE ULNAR (MISCELLANEOUS) ×3 IMPLANT
SEAL CANN UNIV 5-8 DVNC XI (MISCELLANEOUS) ×3 IMPLANT
SEAL XI 5MM-8MM UNIVERSAL (MISCELLANEOUS) ×6
SEALER VESSEL DA VINCI XI (MISCELLANEOUS)
SEALER VESSEL EXT DVNC XI (MISCELLANEOUS) IMPLANT
SET IRRIG Y TYPE TUR BLADDER L (SET/KITS/TRAYS/PACK) IMPLANT
SET TRI-LUMEN FLTR TB AIRSEAL (TUBING) ×3 IMPLANT
SUT VICRYL 0 UR6 27IN ABS (SUTURE) ×3 IMPLANT
SUT VICRYL 4-0 PS2 18IN ABS (SUTURE) ×6 IMPLANT
SUT VLOC 180 0 9IN  GS21 (SUTURE) ×2
SUT VLOC 180 0 9IN GS21 (SUTURE) ×1 IMPLANT
TIP RUMI ORANGE 6.7MMX12CM (TIP) IMPLANT
TIP UTERINE 5.1X6CM LAV DISP (MISCELLANEOUS) IMPLANT
TIP UTERINE 6.7X10CM GRN DISP (MISCELLANEOUS) IMPLANT
TIP UTERINE 6.7X6CM WHT DISP (MISCELLANEOUS) IMPLANT
TIP UTERINE 6.7X8CM BLUE DISP (MISCELLANEOUS) IMPLANT
TOWEL OR 17X26 10 PK STRL BLUE (TOWEL DISPOSABLE) ×3 IMPLANT
TRENDGUARD 450 HYBRID PRO PACK (MISCELLANEOUS) ×3
TROCAR PORT AIRSEAL 5X120 (TROCAR) ×3 IMPLANT
WATER STERILE IRR 1000ML POUR (IV SOLUTION) ×3 IMPLANT

## 2019-03-23 NOTE — Transfer of Care (Signed)
    Last Vitals:  Vitals Value Taken Time  BP    Temp    Pulse    Resp    SpO2      Last Pain:  Vitals:   03/23/19 1107  TempSrc: Oral  PainSc: 0-No pain      Patients Stated Pain Goal: 4 (03/23/19 1107)  Immediate Anesthesia Transfer of Care Note  Patient: Gabrielle [name]  Procedure(s) Performed: Procedure(s) (LRB): XI ROBOTIC ASSISTED OPERATIVE LAPAROSCOPY LYSIS OF ADHESIONS CHOMATUBATION (N/A)  Patient Location: PACU  Anesthesia Type: General  Level of Consciousness: awake, alert  and oriented  Airway & Oxygen Therapy: Patient Spontanous Breathing and Patient connected to nasal cannula oxygen  Post-op Assessment: Report given to PACU RN and Post -op Vital signs reviewed and stable  Post vital signs: Reviewed and stable  Complications: No apparent anesthesia complications

## 2019-03-23 NOTE — H&P (Signed)
Gabrielle [name] is an 34 y.o. female. 34 yo, [id number]. Has Mirena since Nov'20 (this is #2 Mirena) light menses. no pain. At office visit she was noted to have pelvic mass vs large uterus, so pelvic sono was performed.  Office sono (Dec'20) - uterus nl, 100 cc volume. Mirena IUD noted in normal location, fluid within endom noted around IUD. Lt Ovary, enlarged at 9x7x7 cm with multiple homogenous echogenic cystic areas w/ internal echoes, lilely endrometriomas 1= 6.3 x 5.6 x 4.5cm 2= 4.7 x 2.9 x 2.4cm 3=3.7 x 2.6 x 3.3cm 4=2.5 x 1.7 x 2.1cm Simple cystic area = 3.8 x 1.4 x 2.9cm. Lt Adnexa- trace FF Rt Ovary, enlarged at 4.5 x4.5x 2.6 cm. Simple cyst noted= 3.0 x 2.8 x 3.3cm  normal paps (recent Nov'20). no STIs. no breast complaints. Smoker, smokes when anxious, <1/4 ppd. MedHx- anxiety/depression/ Insomnia, Asthma, Eczema   Patient's last menstrual period was 01/29/2019.    Past Medical History:  Diagnosis Date  . Asthma   . Back pain   . Breast mass    benign, L breast    Past Surgical History:  Procedure Laterality Date  . INDUCED ABORTION    . NO PAST SURGERIES      Family History  Problem Relation Age of Onset  . Hypertension Mother   . Hypertension Maternal Grandmother     Social History:  reports that she has been smoking cigarettes. She has a 1.20 pack-year smoking history. She has never used smokeless tobacco. She reports current alcohol use. She reports current drug use. Drug: Marijuana.  Allergies:  Allergies  Allergen Reactions  . Peanut-Containing Drug Products Anaphylaxis    Pecans also  . Shellfish Allergy Anaphylaxis    Medications Prior to Admission  Medication Sig Dispense Refill Last Dose  . albuterol (VENTOLIN HFA) 108 (90 Base) MCG/ACT inhaler Inhale into the lungs every 6 (six) hours as needed for wheezing or shortness of breath.     . busPIRone (BUSPAR) 10 MG tablet Take 10 mg by mouth 2 (two) times daily.     Marland Kitchen escitalopram (LEXAPRO) 20 MG  tablet Take 20 mg by mouth every evening.     Marland Kitchen levonorgestrel (MIRENA, 52 MG,) 20 MCG/24HR IUD 1 each by Intrauterine route once. Nov 2020 inserted     . Loratadine (CLARITIN PO) Take by mouth. 10mg  in am     . traZODone (DESYREL) 50 MG tablet Take 50 mg by mouth at bedtime as needed for sleep.     acetaminophen (TYLENOL) 500 MG tablet Take 1,000 mg by mouth every 6 (six) hours as needed for pain.       Review of Systems  Height 5\' 4"  (1.626 m), weight 59 kg, last menstrual period 01/29/2019, currently breastfeeding. Physical Exam Physical exam:  A&O x 3, no acute distress. Pleasant HEENT neg, no thyromegaly Lungs CTA bilat CV RRR, S1S2 normal Abdo soft, non tender, non acute Extr no edema/ tenderness Pelvic Enlarged pelvic mass.   Results for orders placed or performed during the hospital encounter of 03/23/19 (from the past 24 hour(s))  Pregnancy, urine POC     Status: None   Collection Time: 03/23/19 10:31 AM  Result Value Ref Range   Preg Test, Ur NEGATIVE NEGATIVE    No results found.  Assessment/Plan: 34 yo with bilateral ovarian cysts with very large left ovary, slightly enlarged right ovary. Both appear to be having endometriomas.  Planning daVinci assisted bilateral ovarian cystectomies possible left salpingoophorectomy, fulguration of endometriosis.  Risks/complications of surgery reviewed incl infection, bleeding, damage to internal organs including bladder, bowels, ureters, blood vessels, other risks from anesthesia, VTE and delayed complications of any surgery, complications in future surgery reviewed.  Reviewed risk of recurrence of endometriosis. Has Mirena since 6 yrs but didn't seem to suppress endometriosis, has new Mirena now. Will f/up with serial sono to assess if any other intervention for endometriosis prevention will be needed in future.   Elveria Royals 03/23/2019, 10:32 AM

## 2019-03-23 NOTE — Discharge Instructions (Addendum)
Laparoscopy, Care After This sheet gives you information about how to care for yourself after your procedure. Your health care provider may also give you more specific instructions. If you have problems or questions, contact your health care provider. What can I expect after the procedure? After the procedure, it is common to have:  Mild discomfort in the abdomen.  Sore throat. Women who have laparoscopy with pelvic examination may have mild cramping and fluid coming from the vagina for a few days after the procedure. Follow these instructions at home: Medicines  Take over-the-counter and prescription medicines only as told by your health care provider.  If you were prescribed an antibiotic medicine, take it as told by your health care provider. Do not stop taking the antibiotic even if you start to feel better. Driving  Do not drive for 24 hours if you were given a medicine to help you relax (sedative) during your procedure.  Do not drive or use heavy machinery while taking prescription pain medicine. Bathing  Do not take baths, swim, or use a hot tub until your health care provider approves. You may take showers. Incision care   Follow instructions from your health care provider about how to take care of your incisions. Make sure you: ? Wash your hands with soap and water before you change your bandage (dressing). If soap and water are not available, use hand sanitizer. ? Change your dressing as told by your health care provider. ? Leave stitches (sutures), skin glue, or adhesive strips in place. These skin closures may need to stay in place for 2 weeks or longer. If adhesive strip edges start to loosen and curl up, you may trim the loose edges. Do not remove adhesive strips completely unless your health care provider tells you to do that.  Check your incision areas every day for signs of infection. Check for: ? Redness, swelling, or pain. ? Fluid or blood. ? Warmth. ? Pus or a  bad smell. Activity  Return to your normal activities as told by your health care provider. Ask your health care provider what activities are safe for you.  Do not lift anything that is heavier than 10 lb (4.5 kg), or the limit that you are told, until your health care provider says that it is safe. General instructions  To prevent or treat constipation while you are taking prescription pain medicine, your health care provider may recommend that you: ? Drink enough fluid to keep your urine pale yellow. ? Take over-the-counter or prescription medicines. ? Eat foods that are high in fiber, such as fresh fruits and vegetables, whole grains, and beans. ? Limit foods that are high in fat and processed sugars, such as fried and sweet foods.  Do not use any products that contain nicotine or tobacco, such as cigarettes and e-cigarettes. If you need help quitting, ask your health care provider.  Keep all follow-up visits as told by your health care provider. This is important. Contact a health care provider if:  You develop shoulder pain.  You feel lightheaded or faint.  You are unable to pass gas or have a bowel movement.  You feel nauseous or you vomit.  You develop a rash.  You have redness, swelling, or pain around any incision.  You have fluid or blood coming from any incision.  Any incision feels warm to the touch.  You have pus or a bad smell coming from any incision.  You have a fever or chills. Get help right   away if:  You have severe pain.  You have vomiting that does not go away.  You have heavy bleeding from the vagina.  Any incision opens.  You have trouble breathing.  You have chest pain. Summary  After the procedure, it is common to have mild discomfort in the abdomen and a sore throat.  Check your incision areas every day for signs of infection.  Return to your normal activities as told by your health care provider. Ask your health care provider what  activities are safe for you. This information is not intended to replace advice given to you by your health care provider. Make sure you discuss any questions you have with your health care provider. Document Revised: 01/31/2017 Document Reviewed: 08/14/2016 Elsevier Patient Education  2020 [employer].   Post Anesthesia Home Care Instructions  Activity: Get plenty of rest for the remainder of the day. A responsible individual must stay with you for 24 hours following the procedure.  For the next 24 hours, DO NOT: -Drive a car -Advertising copywriter -Drink alcoholic beverages -Take any medication unless instructed by your physician -Make any legal decisions or sign important papers.  Meals: Start with liquid foods such as gelatin or soup. Progress to regular foods as tolerated. Avoid greasy, spicy, heavy foods. If nausea and/or vomiting occur, drink only clear liquids until the nausea and/or vomiting subsides. Call your physician if vomiting continues.  Special Instructions/Symptoms: Your throat may feel dry or sore from the anesthesia or the breathing tube placed in your throat during surgery. If this causes discomfort, gargle with warm salt water. The discomfort should disappear within 24 hours.

## 2019-03-23 NOTE — Op Note (Signed)
Preoperative diagnosis: Left large multicystic ovarian cyst, right ovarian cyst, likely endometriosis  Postoperative diagnosis: Left tubo-ovarian adhesions, left hydrosalpinx, bilateral tubal blockage suspected Procedure: Da Vinci assisted Operative Laparoscopy, lysis of tubo-ovarian and pelvic bowel adhesions, chromopertubation  Surgeon: Shea Evans, MD Assistants: Noland Fordyce, MD  Anesthesia Gen. Endotracheal IV fluids LR 1000 cc EBL 20 cc  Urine (foley) 200 cc Complications none Disposition PACU and home Specimens none  Procedure Patient presents for Operative laparoscopy.   Risk and complications of surgery including infection, bleeding, damage to internal organs, other complications including pneumonia, VTE were reviewed. Patient voiced understanding. Informed written consent was obtained and patient was brought to the operating room with IV running. 2 gm Ancef given. Timeout was carried out. She underwent general anesthesia without difficulty and was given dorsal lithotomy position. Pelvic examination under anesthesia was normal. She was prepped and draped in standard fashion. Foley catheter placed. Speculum was placed anterior lip of cervix was grasped with tenaculum and Zumi manipulator inserted and balloon inflated.  Gloves, gown were changed attention was focused on the abdomen. A  10 mm curved incision was made at the upper edge of the umbilicus after 0.25% marcaine injection. Incision was carried down to the fascia was incised and peritoneal entry made. Stay sutures placed on fascia with 0-vicryl. Central da vinci port with occluder placed and fascial sutures secured to it.  Laparoscope was introduced. Pneumoperitoneum created. No bleeding noted at entry site.Trendelenburg given. Left lateral robot cannula inserted under vision after marcaine injection and skin incision. Pelvis assessed, Bilateral tubo-ovarian adhesions noted, left hydrosalpinx noted. Some colon filmy adhesions  noted. There was no endometriosis. So, I suspect office pelvic sono findings were sterile PID, especially with asymptomatic presentation. Now Left upper quadrant Flowseal/ assist port inserted under vision and then right lateral robotic cannula inserted under vision, after marcaine injection at all sites. Robot was docked from right. Endo scissors placed in arm 1, camera in arm 2 and bipolar grasper in arm 3. 4th arm was not used. Dr Juliene Pina went to [facility] at this point.  Findings assessed better. Uterus mobile, right tube slight swollen and fimbria adherent to lateral pelvic wall on ovarian fossa. Left tube swollen, fimbria buried in tuboovarian adhesion and ovary not seen well. Left ureter seen and right ureter seen well. Large bowel adhesions to ovarian fossa on left and lateral wall bowel adhesions at pelvic brim level. Liver appeared normal, no peri-hepatic adhesions noted. Blunt dissection and sharp cold scissors dissection done to release bowel adhesions. Left tube fimbria was slowly dissected out of tubo-ovarian adhesion and hydrosalpinx fluid released. Hydro dissection done to further dissect tubo-ovarian mass from ovarian fossa. Now left ureter was seen well. Right fimbrial adhesions released from right lateral wall adhesions and tube was freed up.  Chromopertubation performed with no dye spilling from either tubes, noting likely bilateral blockage, possibly at cornua.  Hemostasis noted except slight oozing under tubal dissection, do Arista sprayed followed by placing Interceed to prevent adhesions.  Pelvis appeared much improved at the end with excellent hemostasis.  All robot instruments removed. Dr Juliene Pina scrubbed back in. Ports removed under vision and pneumoperitoneum was released. Fascial incision closed with stay sutures. All incisions approximated using 4-0 Vicryl in subcuticular fashion. Dermabond was applied at the incision. The uterine manipulator was removed. Hemostasis was excellent. IUD  strings were not seen at the end, likely got pushed in when Zumi was inserted. Gonorrhea/ chlamydia PCR test obtained from cervix. All counts were correct x  2.   Patient brought out to the recovery room after extubation in stable condition. Plan is to discharge home from recovery room. Surgical findings were discussed with patient's family.  Followup with Dr. Benjie Karvonen in office in 2 weeks. I performed this entire surgery.  Manon Hilding, MD

## 2019-03-23 NOTE — Anesthesia Preprocedure Evaluation (Signed)
Anesthesia Evaluation  Patient identified by MRN, date of birth, ID band Patient awake    Reviewed: Allergy & Precautions, NPO status , Patient's Chart, lab work & pertinent test results  Airway Mallampati: II  TM Distance: >3 FB     Dental  (+) Dental Advisory Given   Pulmonary asthma , Current Smoker and Patient abstained from smoking.,    breath sounds clear to auscultation       Cardiovascular negative cardio ROS   Rhythm:Regular Rate:Normal     Neuro/Psych negative neurological ROS     GI/Hepatic negative GI ROS, Neg liver ROS,   Endo/Other  negative endocrine ROS  Renal/GU negative Renal ROS     Musculoskeletal   Abdominal   Peds  Hematology negative hematology ROS (+)   Anesthesia Other Findings   Reproductive/Obstetrics                             Lab Results  Component Value Date   WBC 8.1 03/23/2019   HGB 13.3 03/23/2019   HCT 39.3 03/23/2019   MCV 90.8 03/23/2019   PLT 172 03/23/2019   Lab Results  Component Value Date   CREATININE 0.46 04/10/2007   BUN 4 (L) 04/10/2007   NA 135 04/10/2007   K 3.8 04/10/2007   CL 105 04/10/2007   CO2 23 04/10/2007    Anesthesia Physical Anesthesia Plan  ASA: II  Anesthesia Plan: General   Post-op Pain Management:    Induction: Intravenous  PONV Risk Score and Plan: 2 and Dexamethasone, Ondansetron and Treatment may vary due to age or medical condition  Airway Management Planned: Oral ETT  Additional Equipment:   Intra-op Plan:   Post-operative Plan: Extubation in OR  Informed Consent: I have reviewed the patients History and Physical, chart, labs and discussed the procedure including the risks, benefits and alternatives for the proposed anesthesia with the patient or authorized representative who has indicated his/her understanding and acceptance.     Dental advisory given  Plan Discussed with:  CRNA  Anesthesia Plan Comments:         Anesthesia Quick Evaluation

## 2019-03-23 NOTE — Anesthesia Procedure Notes (Signed)
Procedure Name: Intubation Date/Time: 03/23/2019 1:19 PM Performed by: Mechele Claude, CRNA Pre-anesthesia Checklist: Patient identified, Emergency Drugs available, Suction available and Patient being monitored Patient Re-evaluated:Patient Re-evaluated prior to induction Oxygen Delivery Method: Circle system utilized Preoxygenation: Pre-oxygenation with 100% oxygen Induction Type: IV induction Ventilation: Mask ventilation without difficulty Laryngoscope Size: Mac and 3 Grade View: Grade I Tube type: Oral Tube size: 7.0 mm Number of attempts: 1 Airway Equipment and Method: Stylet Placement Confirmation: ETT inserted through vocal cords under direct vision,  positive ETCO2 and breath sounds checked- equal and bilateral Secured at: 21 (at lip) cm Tube secured with: Tape Dental Injury: Teeth and Oropharynx as per pre-operative assessment

## 2019-03-24 LAB — GC/CHLAMYDIA PROBE AMP (~~LOC~~) NOT AT ARMC
Chlamydia: NEGATIVE
Comment: NEGATIVE
Comment: NORMAL
Neisseria Gonorrhea: NEGATIVE

## 2019-03-24 NOTE — Anesthesia Postprocedure Evaluation (Signed)
Anesthesia Post Note  Patient: Gabrielle [name]  Procedure(s) Performed: XI ROBOTIC ASSISTED OPERATIVE LAPAROSCOPY LYSIS OF ADHESIONS CHOMATUBATION (N/A )     Patient location during evaluation: PACU Anesthesia Type: General Level of consciousness: awake and alert Pain management: pain level controlled Vital Signs Assessment: post-procedure vital signs reviewed and stable Respiratory status: spontaneous breathing, nonlabored ventilation, respiratory function stable and patient connected to nasal cannula oxygen Cardiovascular status: blood pressure returned to baseline and stable Postop Assessment: no apparent nausea or vomiting Anesthetic complications: no    Last Vitals:  Vitals:   03/23/19 1600 03/23/19 1630  BP: 113/83 116/74  Pulse: (!) 57 62  Resp: 18 20  Temp:  36.7 C  SpO2: 95% 96%    Last Pain:  Vitals:   03/24/19 1022  TempSrc:   PainSc: 5                  Kennieth Rad

## 2019-03-29 LAB — CHLAMYDIA PANEL SERUM
C. Pneumoniae IgM Serum: <1:10 {titer}
C. Psittaci IgM Serum: <1:10 {titer}
C. Trachomatis IgG Serum: 1:320 {titer} — AB
C. Trachomatis IgM Serum: 1:32 {titer} — AB

## 2023-08-13 ENCOUNTER — Emergency Department (HOSPITAL_COMMUNITY)
Admission: EM | Admit: 2023-08-13 | Discharge: 2023-08-13 | Disposition: A | Discharge status: Home / Self Care | Payer: Self-pay | Attending: Emergency Medicine | Admitting: Emergency Medicine

## 2023-08-13 ENCOUNTER — Other Ambulatory Visit: Payer: Self-pay

## 2023-08-13 ENCOUNTER — Encounter (HOSPITAL_COMMUNITY): Payer: Self-pay | Admitting: Emergency Medicine

## 2023-08-13 DIAGNOSIS — Z91010 Allergy to peanuts: Secondary | ICD-10-CM | POA: Diagnosis not present

## 2023-08-13 DIAGNOSIS — K0889 Other specified disorders of teeth and supporting structures: Secondary | ICD-10-CM | POA: Diagnosis present

## 2023-08-13 DIAGNOSIS — K047 Periapical abscess without sinus: Secondary | ICD-10-CM | POA: Insufficient documentation

## 2023-08-13 MED ORDER — HYDROCODONE-ACETAMINOPHEN 5-325 MG PO TABS: 1.0000 | ORAL_TABLET | ORAL | 0 refills | Status: AC | PRN

## 2023-08-13 MED ORDER — AMOXICILLIN-POT CLAVULANATE 875-125 MG PO TABS: 1.0000 | ORAL_TABLET | Freq: Two times a day (BID) | ORAL | 0 refills | Status: AC

## 2023-08-13 NOTE — ED Triage Notes (Signed)
 Pt c/o lower left sided jaw pain with swelling since Friday.

## 2023-08-13 NOTE — Discharge Instructions (Signed)
 Take antibiotics as prescribed.  Continue to take ibuprofen  and Tylenol  and then you can take the hydrocodone for breakthrough pain.  Have caution as this can cause dizziness, sleepiness, or other side effects.  Do not take in combination with other meds, alcohol, etc.  Follow-up closely with the oral surgeon on-call, call their office today for an urgent outpatient appointment.  If you develop fever, trouble breathing or swallowing, new or worsening pain or swelling, or any other new/concerning symptoms then return to the ER.

## 2023-08-13 NOTE — ED Provider Notes (Signed)
 [place] EMERGENCY DEPARTMENT AT [facility] Provider Note   CSN: [id number] Arrival date & time: 08/13/23  0865     History  Chief Complaint  Patient presents with   Abscess    Gabrielle [name] is a 38 y.o. female.  HPI 38 year old female presents with left sided jaw swelling.  This started originally about 4 days ago.  No fevers.  She has a painful tooth on her left mandible near the swelling.  Shortly after arriving in the ER, she states that the swelling has started draining.  She denies any trouble breathing or swallowing.  No history of diabetes.  Home Medications Prior to Admission medications   Medication Sig Start Date End Date Taking? Authorizing Provider  amoxicillin-clavulanate (AUGMENTIN) 875-125 MG tablet Take 1 tablet by mouth every 12 (twelve) hours. 08/13/23  Yes Jerilynn Montenegro, MD  HYDROcodone-acetaminophen  (NORCO/VICODIN) 5-325 MG tablet Take 1 tablet by mouth every 4 (four) hours as needed. 08/13/23  Yes Jerilynn Montenegro, MD  acetaminophen  (TYLENOL ) 500 MG tablet Take 1,000 mg by mouth every 6 (six) hours as needed for pain.    [provider]  albuterol  (VENTOLIN  HFA) 108 (90 Base) MCG/ACT inhaler Inhale into the lungs every 6 (six) hours as needed for wheezing or shortness of breath.    [provider]  busPIRone (BUSPAR) 10 MG tablet Take 10 mg by mouth 2 (two) times daily.    [provider]  escitalopram (LEXAPRO) 20 MG tablet Take 20 mg by mouth every evening.    [provider]  ibuprofen  (ADVIL ) 200 MG tablet Take 3 tablets (600 mg total) by mouth every 6 (six) hours as needed. 03/23/19   Terri Fester, MD  levonorgestrel  (MIRENA , 52 MG,) 20 MCG/24HR IUD 1 each by Intrauterine route once. Nov 2020 inserted    [provider]  Loratadine  (CLARITIN  PO) Take by mouth. 10mg  in am    [provider]  traZODone (DESYREL) 50 MG tablet Take 50 mg by mouth at bedtime as needed for sleep.    [provider]      Allergies    Peanut-containing drug products and Shellfish allergy    Review of Systems   Review of Systems  Constitutional:  Negative for fever.  HENT:  Positive for dental problem and facial swelling.   Respiratory:  Negative for shortness of breath.     Physical Exam Updated Vital Signs BP (!) 138/114 (BP Location: Right Arm)   Pulse 88   Temp 98.5 F (36.9 C)   Resp 17   Ht 5' 4 (1.626 m)   Wt 59.7 kg   LMP  (LMP Unknown)   SpO2 100%   BMI 22.59 kg/m  Physical Exam Vitals and nursing note reviewed.  Constitutional:      Appearance: She is well-developed.  HENT:     Head: Normocephalic and atraumatic.     Mouth/Throat:      Comments: No swelling to the floor of the mouth or in her oropharynx besides the abscess. Neck:     Comments: No anterior neck swelling or decreased range of motion. Cardiovascular:     Rate and Rhythm: Normal rate and regular rhythm.     Heart sounds: Normal heart sounds.  Pulmonary:     Effort: Pulmonary effort is normal.     Breath sounds: Normal breath sounds.  Skin:    General: Skin is warm and dry.  Neurological:     Mental Status: She is alert.  ED Results / Procedures / Treatments   Labs (all labs ordered are listed, but only abnormal results are displayed) Labs Reviewed - No data to display  EKG None  Radiology No results found.  Procedures Procedures    Medications Ordered in ED Medications - No data to display  ED Course/ Medical Decision Making/ A&P                                 Medical Decision Making Risk Prescription drug management.   Patient presents with a dental abscess.  She is currently protecting her airway, vital signs are unremarkable besides some mild hypertension.  Given that it is already draining I do not think further I&D is needed.  Will put her on Augmentin and advised her to continue ibuprofen  but will also give a short course of hydrocodone for breakthrough  pain.  Discussed the importance of following up with the oral surgeon on-call.  Low suspicion for deep space infection or impending airway emergency.  Will discharge home with return precautions.        Final Clinical Impression(s) / ED Diagnoses Final diagnoses:  Dental abscess    Rx / DC Orders ED Discharge Orders          Ordered    HYDROcodone-acetaminophen  (NORCO/VICODIN) 5-325 MG tablet  Every 4 hours PRN        08/13/23 0724    amoxicillin-clavulanate (AUGMENTIN) 875-125 MG tablet  Every 12 hours        08/13/23 0724              Jerilynn Montenegro, MD 08/13/23 [phone]

## 2023-08-13 NOTE — ED Notes (Signed)
 See MDs assessment
